# Patient Record
Sex: Male | Born: 1955 | Race: White | Hispanic: No | Smoking: Former smoker
Health system: Southern US, Community
[De-identification: ages and names within clinical notes are randomized; demographics above are authoritative.]

## PROBLEM LIST (undated history)

## (undated) DIAGNOSIS — I119 Hypertensive heart disease without heart failure: Secondary | ICD-10-CM

## (undated) DIAGNOSIS — M797 Fibromyalgia: Secondary | ICD-10-CM

## (undated) DIAGNOSIS — F32A Depression, unspecified: Secondary | ICD-10-CM

## (undated) DIAGNOSIS — I5032 Chronic diastolic (congestive) heart failure: Secondary | ICD-10-CM

## (undated) DIAGNOSIS — M1990 Unspecified osteoarthritis, unspecified site: Secondary | ICD-10-CM

## (undated) DIAGNOSIS — K219 Gastro-esophageal reflux disease without esophagitis: Secondary | ICD-10-CM

## (undated) DIAGNOSIS — I10 Essential (primary) hypertension: Secondary | ICD-10-CM

## (undated) DIAGNOSIS — J449 Chronic obstructive pulmonary disease, unspecified: Secondary | ICD-10-CM

## (undated) DIAGNOSIS — F329 Major depressive disorder, single episode, unspecified: Secondary | ICD-10-CM

## (undated) HISTORY — DX: Chronic diastolic (congestive) heart failure: I50.32

## (undated) HISTORY — PX: SHOULDER SURGERY: SHX246

## (undated) HISTORY — DX: Unspecified osteoarthritis, unspecified site: M19.90

## (undated) HISTORY — PX: HERNIA REPAIR: SHX51

## (undated) HISTORY — DX: Morbid (severe) obesity due to excess calories: E66.01

## (undated) HISTORY — PX: KNEE ARTHROSCOPY: SHX127

## (undated) HISTORY — PX: CHOLECYSTECTOMY: SHX55

## (undated) HISTORY — PX: VASECTOMY: SHX75

## (undated) HISTORY — DX: Hypertensive heart disease without heart failure: I11.9

---

## 2007-02-18 ENCOUNTER — Emergency Department: Payer: Self-pay | Admitting: Emergency Medicine

## 2007-02-21 ENCOUNTER — Emergency Department: Payer: Self-pay | Admitting: Emergency Medicine

## 2008-11-23 ENCOUNTER — Ambulatory Visit: Payer: Self-pay | Admitting: Family Medicine

## 2009-05-11 ENCOUNTER — Ambulatory Visit: Payer: Self-pay | Admitting: Cardiology

## 2009-10-04 ENCOUNTER — Emergency Department: Payer: Self-pay | Admitting: Emergency Medicine

## 2010-01-17 ENCOUNTER — Emergency Department: Payer: Self-pay | Admitting: Emergency Medicine

## 2010-01-23 ENCOUNTER — Ambulatory Visit: Payer: Self-pay | Admitting: Unknown Physician Specialty

## 2010-02-08 ENCOUNTER — Ambulatory Visit: Payer: Self-pay | Admitting: Unknown Physician Specialty

## 2010-08-15 ENCOUNTER — Ambulatory Visit: Payer: Self-pay | Admitting: Pain Medicine

## 2010-08-21 ENCOUNTER — Ambulatory Visit: Payer: Self-pay | Admitting: Pain Medicine

## 2010-08-24 ENCOUNTER — Ambulatory Visit: Payer: Self-pay | Admitting: Pain Medicine

## 2010-09-13 ENCOUNTER — Ambulatory Visit: Payer: Self-pay | Admitting: Pain Medicine

## 2010-09-18 ENCOUNTER — Ambulatory Visit: Payer: Self-pay | Admitting: Pain Medicine

## 2012-01-08 ENCOUNTER — Ambulatory Visit: Payer: Self-pay | Admitting: Gastroenterology

## 2012-01-10 LAB — PATHOLOGY REPORT

## 2012-05-11 ENCOUNTER — Ambulatory Visit: Payer: Self-pay | Admitting: Internal Medicine

## 2012-09-23 ENCOUNTER — Ambulatory Visit: Payer: Self-pay | Admitting: Pain Medicine

## 2012-09-24 ENCOUNTER — Other Ambulatory Visit: Payer: Self-pay | Admitting: Pain Medicine

## 2012-10-21 ENCOUNTER — Ambulatory Visit: Payer: Self-pay | Admitting: Pain Medicine

## 2012-10-27 ENCOUNTER — Ambulatory Visit: Payer: Self-pay | Admitting: Pain Medicine

## 2013-06-15 ENCOUNTER — Ambulatory Visit: Payer: Self-pay | Admitting: Gastroenterology

## 2013-09-11 ENCOUNTER — Emergency Department: Payer: Self-pay | Admitting: Emergency Medicine

## 2013-09-13 ENCOUNTER — Ambulatory Visit: Payer: Self-pay | Admitting: Gastroenterology

## 2014-02-23 DIAGNOSIS — Z8719 Personal history of other diseases of the digestive system: Secondary | ICD-10-CM | POA: Insufficient documentation

## 2014-02-23 DIAGNOSIS — G8929 Other chronic pain: Secondary | ICD-10-CM | POA: Insufficient documentation

## 2014-02-23 DIAGNOSIS — M549 Dorsalgia, unspecified: Secondary | ICD-10-CM

## 2014-02-23 DIAGNOSIS — K5909 Other constipation: Secondary | ICD-10-CM | POA: Insufficient documentation

## 2014-09-05 ENCOUNTER — Emergency Department: Payer: Self-pay | Admitting: Emergency Medicine

## 2014-11-23 ENCOUNTER — Ambulatory Visit
Admission: RE | Admit: 2014-11-23 | Discharge: 2014-11-23 | Disposition: A | Discharge status: Home / Self Care | Payer: Medicaid Other | Source: Ambulatory Visit | Attending: Surgery | Admitting: Surgery

## 2014-11-23 ENCOUNTER — Encounter
Admission: RE | Admit: 2014-11-23 | Discharge: 2014-11-23 | Disposition: A | Discharge status: Home / Self Care | Payer: Medicaid Other | Source: Ambulatory Visit | Attending: Surgery | Admitting: Surgery

## 2014-11-23 DIAGNOSIS — Z720 Tobacco use: Secondary | ICD-10-CM | POA: Insufficient documentation

## 2014-11-23 DIAGNOSIS — Z01810 Encounter for preprocedural cardiovascular examination: Secondary | ICD-10-CM | POA: Diagnosis present

## 2014-11-23 DIAGNOSIS — F329 Major depressive disorder, single episode, unspecified: Secondary | ICD-10-CM | POA: Insufficient documentation

## 2014-11-23 DIAGNOSIS — M1990 Unspecified osteoarthritis, unspecified site: Secondary | ICD-10-CM | POA: Diagnosis not present

## 2014-11-23 DIAGNOSIS — Z01811 Encounter for preprocedural respiratory examination: Secondary | ICD-10-CM | POA: Insufficient documentation

## 2014-11-23 DIAGNOSIS — F17200 Nicotine dependence, unspecified, uncomplicated: Secondary | ICD-10-CM

## 2014-11-23 DIAGNOSIS — M797 Fibromyalgia: Secondary | ICD-10-CM | POA: Diagnosis not present

## 2014-11-23 DIAGNOSIS — I10 Essential (primary) hypertension: Secondary | ICD-10-CM | POA: Insufficient documentation

## 2014-11-23 DIAGNOSIS — J449 Chronic obstructive pulmonary disease, unspecified: Secondary | ICD-10-CM | POA: Insufficient documentation

## 2014-11-23 HISTORY — DX: Depression, unspecified: F32.A

## 2014-11-23 HISTORY — DX: Fibromyalgia: M79.7

## 2014-11-23 HISTORY — DX: Essential (primary) hypertension: I10

## 2014-11-23 HISTORY — DX: Chronic obstructive pulmonary disease, unspecified: J44.9

## 2014-11-23 HISTORY — DX: Unspecified osteoarthritis, unspecified site: M19.90

## 2014-11-23 HISTORY — DX: Gastro-esophageal reflux disease without esophagitis: K21.9

## 2014-11-23 HISTORY — DX: Major depressive disorder, single episode, unspecified: F32.9

## 2014-11-23 NOTE — Patient Instructions (Signed)
  Your procedure is scheduled on: November 30 2014 Wednesday Report to Day Surgery. [facility] Entrance To find out your arrival time please call [phone] between 1PM - 3PM on Tuesday 11/29/2014.  Remember: Instructions that are not followed completely may result in serious medical risk, up to and including death, or upon the discretion of your surgeon and anesthesiologist your surgery may need to be rescheduled.    __x__ 1. Do not eat food or drink liquids after midnight. No gum chewing or hard candies.     __x__ 2. No Alcohol for 24 hours before or after surgery.   ____ 3. Bring all medications with you on the day of surgery if instructed.    __x__ 4. Notify your doctor if there is any change in your medical condition     (cold, fever, infections).     Do not wear jewelry, make-up, hairpins, clips or nail polish.  Do not wear lotions, powders, or perfumes. You may wear deodorant.  Do not shave 48 hours prior to surgery. Men may shave face and neck.  Do not bring valuables to the hospital.    [facility] is not responsible for any belongings or valuables.               Contacts, dentures or bridgework may not be worn into surgery.  Leave your suitcase in the car. After surgery it may be brought to your room.  For patients admitted to the hospital, discharge time is determined by your                treatment team.   Patients discharged the day of surgery will not be allowed to drive home.   Please read over the following fact sheets that you were given:   Surgical Site Infection Prevention   __x__ Take these medicines the morning of surgery with A SIP OF WATER:    1. lyrica  2. lisinopril  3. nexium  4.  5.  6.  ____ Fleet Enema (as directed)   __x__ Use CHG Soap as directed  ____ Use inhalers on the day of surgery  ____ Stop metformin 2 days prior to surgery    ____ Take 1/2 of usual insulin dose the night before surgery and none on the morning of surgery.   ____  Stop Coumadin/Plavix/aspirin on   ____ Stop Anti-inflammatories on    ____ Stop supplements until after surgery.    ____ Bring C-Pap to the hospital.

## 2014-11-29 ENCOUNTER — Encounter
Admission: RE | Admit: 2014-11-29 | Discharge: 2014-11-29 | Disposition: A | Discharge status: Home / Self Care | Payer: Medicaid Other | Source: Ambulatory Visit | Attending: Surgery | Admitting: Surgery

## 2014-11-29 DIAGNOSIS — Z01812 Encounter for preprocedural laboratory examination: Secondary | ICD-10-CM | POA: Insufficient documentation

## 2014-11-29 DIAGNOSIS — R222 Localized swelling, mass and lump, trunk: Secondary | ICD-10-CM | POA: Insufficient documentation

## 2014-11-29 LAB — CBC
HEMATOCRIT: 46.9 % (ref 40.0–52.0)
HEMOGLOBIN: 15 g/dL (ref 13.0–18.0)
MCH: 30.9 pg (ref 26.0–34.0)
MCHC: 32.1 g/dL (ref 32.0–36.0)
MCV: 96.4 fL (ref 80.0–100.0)
Platelets: 206 10*3/uL (ref 150–440)
RBC: 4.86 MIL/uL (ref 4.40–5.90)
RDW: 15.6 % — ABNORMAL HIGH (ref 11.5–14.5)
WBC: 11.4 10*3/uL — ABNORMAL HIGH (ref 3.8–10.6)

## 2014-11-29 LAB — BASIC METABOLIC PANEL
Anion gap: 11 (ref 5–15)
BUN: 15 mg/dL (ref 6–20)
CALCIUM: 8.9 mg/dL (ref 8.9–10.3)
CO2: 31 mmol/L (ref 22–32)
CREATININE: 0.9 mg/dL (ref 0.61–1.24)
Chloride: 100 mmol/L — ABNORMAL LOW (ref 101–111)
GFR calc Af Amer: >60 mL/min (ref >60)
GFR calc non Af Amer: >60 mL/min (ref >60)
Glucose, Bld: 161 mg/dL — ABNORMAL HIGH (ref 65–99)
Potassium: 4.1 mmol/L (ref 3.5–5.1)
Sodium: 142 mmol/L (ref 135–145)

## 2014-12-07 ENCOUNTER — Observation Stay
Admission: RE | Admit: 2014-12-07 | Discharge: 2014-12-07 | Disposition: A | Discharge status: Home / Self Care | Payer: Medicaid Other | Source: Ambulatory Visit | Attending: Surgery | Admitting: Surgery

## 2014-12-07 ENCOUNTER — Encounter
Admission: RE | Disposition: A | Discharge status: Home / Self Care | Payer: Self-pay | Source: Ambulatory Visit | Attending: Surgery

## 2014-12-07 ENCOUNTER — Ambulatory Visit: Payer: Medicaid Other | Admitting: Certified Registered"

## 2014-12-07 ENCOUNTER — Encounter: Payer: Self-pay | Admitting: *Deleted

## 2014-12-07 DIAGNOSIS — F17200 Nicotine dependence, unspecified, uncomplicated: Secondary | ICD-10-CM | POA: Diagnosis not present

## 2014-12-07 DIAGNOSIS — E669 Obesity, unspecified: Secondary | ICD-10-CM | POA: Insufficient documentation

## 2014-12-07 DIAGNOSIS — I10 Essential (primary) hypertension: Secondary | ICD-10-CM | POA: Insufficient documentation

## 2014-12-07 DIAGNOSIS — Z79899 Other long term (current) drug therapy: Secondary | ICD-10-CM | POA: Diagnosis not present

## 2014-12-07 DIAGNOSIS — L918 Other hypertrophic disorders of the skin: Secondary | ICD-10-CM | POA: Diagnosis not present

## 2014-12-07 DIAGNOSIS — Z9889 Other specified postprocedural states: Secondary | ICD-10-CM | POA: Diagnosis not present

## 2014-12-07 DIAGNOSIS — G4730 Sleep apnea, unspecified: Secondary | ICD-10-CM | POA: Insufficient documentation

## 2014-12-07 DIAGNOSIS — Z9049 Acquired absence of other specified parts of digestive tract: Secondary | ICD-10-CM | POA: Insufficient documentation

## 2014-12-07 DIAGNOSIS — M1990 Unspecified osteoarthritis, unspecified site: Secondary | ICD-10-CM | POA: Insufficient documentation

## 2014-12-07 DIAGNOSIS — Z6837 Body mass index (BMI) 37.0-37.9, adult: Secondary | ICD-10-CM | POA: Insufficient documentation

## 2014-12-07 DIAGNOSIS — D225 Melanocytic nevi of trunk: Secondary | ICD-10-CM | POA: Insufficient documentation

## 2014-12-07 DIAGNOSIS — Z791 Long term (current) use of non-steroidal anti-inflammatories (NSAID): Secondary | ICD-10-CM | POA: Diagnosis not present

## 2014-12-07 DIAGNOSIS — L723 Sebaceous cyst: Principal | ICD-10-CM | POA: Insufficient documentation

## 2014-12-07 DIAGNOSIS — J449 Chronic obstructive pulmonary disease, unspecified: Secondary | ICD-10-CM | POA: Insufficient documentation

## 2014-12-07 DIAGNOSIS — L720 Epidermal cyst: Secondary | ICD-10-CM | POA: Diagnosis present

## 2014-12-07 DIAGNOSIS — D2271 Melanocytic nevi of right lower limb, including hip: Secondary | ICD-10-CM | POA: Diagnosis not present

## 2014-12-07 DIAGNOSIS — L729 Follicular cyst of the skin and subcutaneous tissue, unspecified: Secondary | ICD-10-CM | POA: Diagnosis present

## 2014-12-07 DIAGNOSIS — F329 Major depressive disorder, single episode, unspecified: Secondary | ICD-10-CM | POA: Diagnosis not present

## 2014-12-07 DIAGNOSIS — K219 Gastro-esophageal reflux disease without esophagitis: Secondary | ICD-10-CM | POA: Insufficient documentation

## 2014-12-07 HISTORY — PX: IRRIGATION AND DEBRIDEMENT SEBACEOUS CYST: SHX5255

## 2014-12-07 SURGERY — IRRIGATION AND DEBRIDEMENT SEBACEOUS CYST
Anesthesia: General

## 2014-12-07 MED ORDER — HYDROCODONE-ACETAMINOPHEN 5-325 MG PO TABS: 1.0000 | ORAL_TABLET | Freq: Four times a day (QID) | ORAL | Status: DC | PRN

## 2014-12-07 MED ORDER — LIDOCAINE HCL (PF) 1 % IJ SOLN
INTRAMUSCULAR | Status: AC
  Filled 2014-12-07: qty 30

## 2014-12-07 MED ORDER — BUPIVACAINE HCL (PF) 0.5 % IJ SOLN
INTRAMUSCULAR | Status: AC
  Filled 2014-12-07: qty 30

## 2014-12-07 MED ORDER — FENTANYL CITRATE (PF) 100 MCG/2ML IJ SOLN
INTRAMUSCULAR | Status: AC
  Filled 2014-12-07: qty 2

## 2014-12-07 MED ORDER — FENTANYL CITRATE (PF) 100 MCG/2ML IJ SOLN
25.0000 ug | INTRAMUSCULAR | Status: DC | PRN
  Administered 2014-12-07 (×4): 25 ug via INTRAVENOUS

## 2014-12-07 MED ORDER — SODIUM BICARBONATE 4 % IV SOLN
INTRAVENOUS | Status: AC
  Filled 2014-12-07: qty 5

## 2014-12-07 MED ORDER — DEXTROSE-NACL 5-0.45 % IV SOLN
INTRAVENOUS | Status: DC
  Administered 2014-12-07: 13:00:00 via INTRAVENOUS

## 2014-12-07 MED ORDER — MORPHINE SULFATE 4 MG/ML IJ SOLN: 4.0000 mg | INTRAMUSCULAR | Status: DC | PRN

## 2014-12-07 MED ORDER — HYDROCODONE-ACETAMINOPHEN 5-325 MG PO TABS
1.0000 | ORAL_TABLET | Freq: Four times a day (QID) | ORAL | Status: DC | PRN
  Administered 2014-12-07: 1 via ORAL
  Filled 2014-12-07: qty 1

## 2014-12-07 MED ORDER — ONDANSETRON HCL 4 MG/2ML IJ SOLN: 4.0000 mg | Freq: Once | INTRAMUSCULAR | Status: DC | PRN

## 2014-12-07 MED ORDER — ESMOLOL HCL 10 MG/ML IV SOLN
INTRAVENOUS | Status: DC | PRN
  Administered 2014-12-07 (×2): 20 mg via INTRAVENOUS
  Administered 2014-12-07: 30 mg via INTRAVENOUS

## 2014-12-07 MED ORDER — LACTATED RINGERS IV SOLN
INTRAVENOUS | Status: DC
  Administered 2014-12-07: 50 mL/h via INTRAVENOUS
  Administered 2014-12-07: 09:00:00 via INTRAVENOUS

## 2014-12-07 MED ORDER — MIDAZOLAM HCL 2 MG/2ML IJ SOLN
INTRAMUSCULAR | Status: DC | PRN
  Administered 2014-12-07 (×2): 1 mg via INTRAVENOUS
  Administered 2014-12-07: 2 mg via INTRAVENOUS

## 2014-12-07 MED ORDER — SODIUM BICARBONATE 4 % IV SOLN
INTRAVENOUS | Status: DC | PRN
  Administered 2014-12-07: 35 mL via SUBCUTANEOUS

## 2014-12-07 MED ORDER — FENTANYL CITRATE (PF) 100 MCG/2ML IJ SOLN
INTRAMUSCULAR | Status: DC | PRN
  Administered 2014-12-07 (×3): 25 ug via INTRAVENOUS
  Administered 2014-12-07: 50 ug via INTRAVENOUS
  Administered 2014-12-07: 25 ug via INTRAVENOUS
  Administered 2014-12-07: 50 ug via INTRAVENOUS
  Administered 2014-12-07 (×2): 25 ug via INTRAVENOUS
  Administered 2014-12-07: 50 ug via INTRAVENOUS

## 2014-12-07 MED ORDER — FAMOTIDINE 20 MG PO TABS
20.0000 mg | ORAL_TABLET | Freq: Every day | ORAL | Status: DC
  Administered 2014-12-07: 20 mg via ORAL
  Filled 2014-12-07: qty 1

## 2014-12-07 MED ORDER — PROPOFOL INFUSION 10 MG/ML OPTIME
INTRAVENOUS | Status: DC | PRN
  Administered 2014-12-07: 50 ug/kg/min via INTRAVENOUS

## 2014-12-07 SURGICAL SUPPLY — 24 items
BLADE SURG 15 STRL LF DISP TIS (BLADE) ×2 IMPLANT
BLADE SURG 15 STRL SS (BLADE) ×4
DRAPE LAPAROTOMY 100X77 ABD (DRAPES) ×3 IMPLANT
DRSG TEGADERM 2-3/8X2-3/4 SM (GAUZE/BANDAGES/DRESSINGS) ×15 IMPLANT
DRSG TEGADERM 4X4.75 (GAUZE/BANDAGES/DRESSINGS) ×3 IMPLANT
DRSG TELFA 3X8 NADH (GAUZE/BANDAGES/DRESSINGS) ×3 IMPLANT
ELECT CAUTERY NEEDLE TIP 1.0 (MISCELLANEOUS) ×3
ELECTRODE CAUTERY NEDL TIP 1.0 (MISCELLANEOUS) ×1 IMPLANT
GAUZE SPONGE 4X4 12PLY STRL (GAUZE/BANDAGES/DRESSINGS) ×3 IMPLANT
GLOVE BIO SURGEON STRL SZ7.5 (GLOVE) ×9 IMPLANT
GLOVE INDICATOR 8.0 STRL GRN (GLOVE) ×3 IMPLANT
GOWN STRL REUS W/ TWL LRG LVL3 (GOWN DISPOSABLE) ×3 IMPLANT
GOWN STRL REUS W/TWL LRG LVL3 (GOWN DISPOSABLE) ×6
KIT RM TURNOVER STRD PROC AR (KITS) ×3 IMPLANT
LABEL OR SOLS (LABEL) IMPLANT
NDL SAFETY 25GX1.5 (NEEDLE) ×3 IMPLANT
NS IRRIG 500ML POUR BTL (IV SOLUTION) ×3 IMPLANT
PACK BASIN MINOR ARMC (MISCELLANEOUS) ×3 IMPLANT
PAD GROUND ADULT SPLIT (MISCELLANEOUS) ×3 IMPLANT
SUT ETHILON 3-0 KS 30 BLK (SUTURE) ×9 IMPLANT
SUT VIC AB 3-0 SH 27 (SUTURE) ×2
SUT VIC AB 3-0 SH 27X BRD (SUTURE) ×1 IMPLANT
SYR BULB EAR ULCER 3OZ GRN STR (SYRINGE) ×3 IMPLANT
SYRINGE 10CC LL (SYRINGE) ×3 IMPLANT

## 2014-12-07 NOTE — Anesthesia Postprocedure Evaluation (Signed)
  Anesthesia Post-op Note  Patient: Hunter White  Procedure(s) Performed: Procedure(s): IRRIGATION AND DEBRIDEMENT SEBACEOUS CYST (N/A)  Anesthesia type:General  Patient location: PACU  Post pain: Pain level controlled  Post assessment: Post-op Vital signs reviewed, Patient's Cardiovascular Status Stable, Respiratory Function Stable, Patent Airway and No signs of Nausea or vomiting  Post vital signs: Reviewed and stable  Last Vitals:  Filed Vitals:   12/07/14 1029  BP:   Pulse:   Temp: 37.3 C  Resp:     Level of consciousness: awake, alert  and patient cooperative  Complications: No apparent anesthesia complications

## 2014-12-07 NOTE — Progress Notes (Signed)
Per Dr. Leanora Cover Regular diet order

## 2014-12-07 NOTE — Op Note (Signed)
12/07/2014  10:29 AM  PATIENT:  Hunter White  59 y.o. male  PRE-OPERATIVE DIAGNOSIS:  SEBACEOUS CYST  POST-OPERATIVE DIAGNOSIS:  same  PROCEDURE:  Procedure(s): IRRIGATION AND DEBRIDEMENT SEBACEOUS CYST (N/A)  SURGEON:  Surgeon(s) and Role:    * Dia Crawford III, MD - Primary   ASSISTANTS: none   ANESTHESIA:   MAC  EBL:  Total I/O In: 700 [I.V.:700] Out: 5 [Blood:5]   DRAINS: none   LOCAL MEDICATIONS USED:  LIDOCAINE    DISPOSITION OF SPECIMEN:  PATHOLOGY   DICTATION: .Dragon Dictation with the patient supine position infection appropriate sedation the patient was then turned into the prone position appropriately padded and positioned. He had multiple areas on his back which a previously been marked. Each was injected with 1% Xylocaine buffered with sodium bicarbonate elliptical incision was made around each. Attempt was made remove all of the cyst wall and each of the 4 areas on his back. One in the mid back to the previously been incised was the deepest and required the deepest. Subcutaneous space was obliterated with 3-0 Vicryl skin was closed with 3-0 nylon. 3 were closed simply with 3-0 nylon sutures on the skin. Sterile dressings were applied. The area was then reprepped and draped around the buttock and thigh lesions. Each was ellipsed after Xylocaine anesthesia and closed with 3-0 nylon. Sterile dressings were applied. The patient recovery room having tolerated procedure well. Sponge instrument needle count were correct 2 in the operative.   PLAN OF CARE: Discharge to home after PACU  PATIENT DISPOSITION:  PACU - hemodynamically stable.   Dia Crawford III, MD

## 2014-12-07 NOTE — Progress Notes (Signed)
Preoperative Review   Patient is met in the preoperative holding area. The history is reviewed in the chart and with the patient. I personally reviewed the options and rationale as well as the risks of this procedure that have been previously discussed with the patient. All questions asked by the patient and/or family were answered to their satisfaction.  Patient agrees to proceed with this procedure at this time.  His cardiac and pulmonary exams are unchanged. There are 2 other nodules one on his left buttock and one on his right thigh that he would like to have removed in addition to the lesions on his back. We have planned these elective excisions under monitored anesthetic care with his severe chronic lung disease.   

## 2014-12-07 NOTE — Transfer of Care (Signed)
Immediate Anesthesia Transfer of Care Note  Patient: Hunter White  Procedure(s) Performed: Procedure(s): IRRIGATION AND DEBRIDEMENT SEBACEOUS CYST (N/A)  Patient Location: PACU  Anesthesia Type:General  Level of Consciousness: sedated  Airway & Oxygen Therapy: Patient Spontanous Breathing  Post-op Assessment: Report given to RN and Post -op Vital signs reviewed and stable  Post vital signs: Reviewed and stable  Last Vitals:  Filed Vitals:   12/07/14 0741  BP: 163/85  Pulse: 86  Temp: 36.7 C  Resp: 18    Complications: No apparent anesthesia complications

## 2014-12-07 NOTE — Progress Notes (Addendum)
Pt A and O x 4. VSS. Pt tolerating diet well. Pt had surgery with 6 sites. Pt has a small amount of drainage on dressing. Pt was not here for whole shift. Pt was admitted because he could not be weaned from O2. Pt was eventually weaned and had no problems with sats dropping. Pt was discharged and IV removed. Pt had prescriptions given and discharge instructions given. Pt voiced that he understood directions and had no other questions. Pt left via wheelchair with auxillary.

## 2014-12-07 NOTE — Discharge Summary (Signed)
Patient ID: ORVAN PAPADAKIS MRN: [id number] DOB/AGE: [birth year] 59 y.o.  Admit date: 12/07/2014 Discharge date: 12/07/2014  Discharge Diagnoses:  Sebaceous cysts  Procedures Performed: Excision multiple sebaceous cysts  Discharged Condition: fair  Hospital Course: He was admitted through same-day surgery for excision of multiple sebaceous cysts and skin tags. The procedure was uncomplicated and accomplished under monitored anesthetic care anesthesia. However with his obesity chronic lung disease and sleep apnea we thought an important observe him for 6-8 hours following surgery. He's done well currently off oxygen with a reasonable oxygen saturation. We will discharge him home this evening in follow-up in the office in 7-10 days time.  Discharge Orders: Discharge Instructions    Call MD for:  redness, tenderness, or signs of infection (pain, swelling, redness, odor or green/yellow discharge around incision site)    Complete by:  As directed      Call MD for:  severe uncontrolled pain    Complete by:  As directed      Call MD for:  temperature >100.4    Complete by:  As directed      Diet - low sodium heart healthy    Complete by:  As directed      Diet - low sodium heart healthy    Complete by:  As directed      Driving Restrictions    Complete by:  As directed   No driving while taking pain medication     Increase activity slowly    Complete by:  As directed      Increase activity slowly    Complete by:  As directed      Lifting restrictions    Complete by:  As directed   No heavy lifting     Remove dressing in 48 hours    Complete by:  As directed      Remove dressing in 48 hours    Complete by:  As directed            Disposition: Final discharge disposition not confirmed  Discharge Medications:  Current facility-administered medications:  .  dextrose 5 %-0.45 % sodium chloride infusion, , Intravenous, Continuous, Dia Crawford III, MD, Last Rate: 50 mL/hr at 12/07/14  1301 .  famotidine (PEPCID) tablet 20 mg, 20 mg, Oral, Daily, Dia Crawford III, MD, 20 mg at 12/07/14 1300 .  fentaNYL (SUBLIMAZE) 100 MCG/2ML injection, , , ,  .  HYDROcodone-acetaminophen (NORCO/VICODIN) 5-325 MG per tablet 1-2 tablet, 1-2 tablet, Oral, Q6H PRN, Dia Crawford III, MD, 1 tablet at 12/07/14 1313 .  morphine 4 MG/ML injection 4 mg, 4 mg, Intravenous, Q2H PRN, Dia Crawford III, MD  Follwup: Follow-up Information    Schedule an appointment as soon as possible for a visit with [facility] [place].   Why:  For suture removal, For wound re-check   Contact information:   [place] [phone]      Signed: Dia Crawford III 12/07/2014, 5:36 PM

## 2014-12-07 NOTE — Discharge Instructions (Signed)
Notify MD for any worsening redness, swelling, bleeding or drainage from the incision sites, fever of 100.4 or higher or pain that is not relieved with medications.   Be sure to call Dr. Rolin Barry office tomorrow for a follow-up appointment in 1 week.  [phone])

## 2014-12-07 NOTE — Anesthesia Preprocedure Evaluation (Addendum)
Anesthesia Evaluation  Patient identified by MRN, date of birth, ID band Patient awake    Reviewed: Allergy & Precautions, NPO status , Patient's Chart, lab work & pertinent test results  Airway Mallampati: II       Dental   Pulmonary shortness of breath, with exertion and lying, COPD COPD inhaler, Current Smoker,          Cardiovascular hypertension, Pt. on medications     Neuro/Psych Depression  Neuromuscular disease    GI/Hepatic Neg liver ROS, GERD-  ,  Endo/Other  Hypothyroidism Morbid obesity  Renal/GU   negative genitourinary   Musculoskeletal  (+) Fibromyalgia -  Abdominal   Peds negative pediatric ROS (+)  Hematology negative hematology ROS (+)   Anesthesia Other Findings   Reproductive/Obstetrics                            Anesthesia Physical Anesthesia Plan  ASA: III  Anesthesia Plan: MAC   Post-op Pain Management:    Induction: Intravenous  Airway Management Planned: Natural Airway and Nasal Cannula  Additional Equipment:   Intra-op Plan:   Post-operative Plan:   Informed Consent: I have reviewed the patients History and Physical, chart, labs and discussed the procedure including the risks, benefits and alternatives for the proposed anesthesia with the patient or authorized representative who has indicated his/her understanding and acceptance.     Plan Discussed with: CRNA  Anesthesia Plan Comments:         Anesthesia Quick Evaluation

## 2014-12-08 LAB — SURGICAL PATHOLOGY

## 2014-12-21 ENCOUNTER — Ambulatory Visit (INDEPENDENT_AMBULATORY_CARE_PROVIDER_SITE_OTHER): Payer: Medicaid Other | Admitting: Surgery

## 2014-12-21 ENCOUNTER — Encounter: Payer: Self-pay | Admitting: Surgery

## 2014-12-21 VITALS — BP 154/73 | HR 79 | Temp 97.9°F | Ht 70.0 in | Wt 262.8 lb

## 2014-12-21 DIAGNOSIS — E669 Obesity, unspecified: Secondary | ICD-10-CM

## 2014-12-21 DIAGNOSIS — L729 Follicular cyst of the skin and subcutaneous tissue, unspecified: Secondary | ICD-10-CM

## 2014-12-21 NOTE — Progress Notes (Signed)
Patient is status post excision of multiple sebaceous cysts on his back. This was performed on June 8. He is doing well. All the wounds healed nicely except that on the right posterior shoulder.  Postoperatively he had a prolonged stay in the recovery room due to respiratory distress.  He is requesting pulmonary medicine consultation. Furthermore, he has multiple axillary skin tags which he would liked to be removed in the office at a later date and this will be arranged  Physical examination demonstrated a morbidly obese white male.   Sutures were removed from all the operative sites. Right posterior shoulder operative site demonstrates superficial dehiscence.   There is granulation tissue present and no cellulitis or undrained purulence.  Impression overall doing well except for one operative site.   Plan: Dry dressing to right upper posterior shoulder. Scheduled follow-up with Dr. Pat Patrick at the later time for excision of multiple skin tags. Palpation pulmonary medicine consultation.

## 2014-12-21 NOTE — Patient Instructions (Signed)
Call our office with any questions or concerns.   Follow-up in our office with Dr. Pat Patrick to discuss skin tag removal.  Wash with soap and water and apply neosporin and quaze to right shoulder wound daily.  We will refer you to a Pulmonologist and our office will be in contact as soon as this appointment is made.

## 2014-12-23 ENCOUNTER — Other Ambulatory Visit: Payer: Self-pay

## 2014-12-28 ENCOUNTER — Encounter: Payer: Self-pay | Admitting: Internal Medicine

## 2014-12-28 ENCOUNTER — Ambulatory Visit (INDEPENDENT_AMBULATORY_CARE_PROVIDER_SITE_OTHER): Payer: Medicaid Other | Admitting: Internal Medicine

## 2014-12-28 ENCOUNTER — Institutional Professional Consult (permissible substitution): Payer: Medicaid Other | Admitting: Internal Medicine

## 2014-12-28 DIAGNOSIS — R05 Cough: Secondary | ICD-10-CM | POA: Insufficient documentation

## 2014-12-28 DIAGNOSIS — J449 Chronic obstructive pulmonary disease, unspecified: Secondary | ICD-10-CM | POA: Diagnosis not present

## 2014-12-28 DIAGNOSIS — R0902 Hypoxemia: Secondary | ICD-10-CM

## 2014-12-28 DIAGNOSIS — Z720 Tobacco use: Secondary | ICD-10-CM

## 2014-12-28 DIAGNOSIS — G479 Sleep disorder, unspecified: Secondary | ICD-10-CM

## 2014-12-28 DIAGNOSIS — R053 Chronic cough: Secondary | ICD-10-CM | POA: Insufficient documentation

## 2014-12-28 NOTE — Assessment & Plan Note (Signed)
Tobacco Cessation - Counseling regarding benefits of smoking cessation strategies was provided for more than 12 min. - Educated that at this time smoking- cessation represents the single most important step that patient can take to enhance the length and quality of live. - Educated patient regarding alternatives of behavior interventions, pharmacotherapy including NRT and non-nicotine therapy such, and combinations of both. - Patient at this time will try to quit on his own. 

## 2014-12-28 NOTE — Assessment & Plan Note (Addendum)
Clinical history falls in line with a diagnosis of COPD, will further evaluate with pulmonary function testing to class level of obstruction objectively. 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 Patient is to reduce smoking even more and plan for eventually quitting smoking. mMRC =3, CAT=23 Ironically, given the level of his scoring he's not had frequent exacerbations or need for steroids or urgent care/emergency room visits. Today in the office he had a brief6MWT and had stopped early due to chronic leg and hip pain, his lowest saturation was 84% and he rebound quickly to 87%.  Plan: -Tobacco Cessation -Pulmonary function testing and 6 minute walk test -Weight loss and continue with exercise at the pool -Dietary and lifestyle modifications -Overnight pulse ox surgery study -Pursed lip breathing

## 2014-12-28 NOTE — Assessment & Plan Note (Addendum)
She noted with saturations today resting at 85% that improved a 7%. He had a 70 walk tests that showed a low saturation of 84" rebound 87%, lowest heart was noted to be at 89 highest heart rate was noted to be at 110. He walked about 144 m.  I believe the patient may have COPD along with sleep apnea and that adding to his hypoxia. Further workup and evaluation as dictated and plan for COPD and OSA Also, plan for overnight pulse oximetry study

## 2014-12-28 NOTE — Assessment & Plan Note (Signed)
Patient with significant risk factors for obstructive sleep apnea. STOP BANG = 8/8  Plan: Split-night study

## 2014-12-28 NOTE — Assessment & Plan Note (Signed)
Secondary to COPD and tobacco abuse.

## 2014-12-28 NOTE — Progress Notes (Signed)
Date: 12/28/2014  MRN# [id number] Hunter White Jan 16, 1956  Referring Physician:   LAITH ANTONELLI is a 59 y.o. old male seen in consultation for COPD and OSA evaluation  CC:  Chief Complaint  Patient presents with  . Advice Only    Pt referred by Dr. Marina Gravel for sob. MMRC 3 Pt says this has been ongoing for 6 months. Pt has occas productive cough, wheezing and chest tightness.    HPI:  Patient is a pleasant 59 year old male presenting today for evaluation of COPD and affect and sleep apnea. He was recently seen at the surgical center for sebaceous cyst removal on his back, at that time he was noted to have apneic events and descending down to the lower 80s, this was performed on June 8, he was referred to pulmonary for further evaluation. Patient states that he's been having shortness of breath especially with exertion for the past 5-6 months around his doctor's visits he may have oxygen saturations in the mid to upper 80s and was advised to do breathing exercises which include purse lipped breathing, but he states this is been difficult for him due to his anxiety level. Endorses shortness of breath with exertion, cough (all the time) cough is mild productive of clear sputum. Patient states he is a long-time smoker, 2 packs per day for at least 20 years currently down to 67 cigarettes per day, no alcohol abuse, had a previous cocaine use (last used 10 years ago). Today patient is also accompanied by his daughter who states that he does have sleeping and snoring problems; see questionnaire below. Overall patient has been told he has COPD that has not been formally diagnosed with it, he denies any urgent care/emergency room visits or need for steroids in the last 2-3 years. He does endorse intermittent leg swelling (left greater than right)   Obstructive Sleep Apnea Screening The patient was screened with the STOP-BANG questionnaire. >3 positive responses is considered a positive  screen  Snoring  YES Tiredness  YES Observed Apnea YES Pressure (HTN) YES  BMI >35  YES Age > 50  YES Neck >17"  YES Gender (male) YES  Total: 8/8   Screen: POSITIVE      PMHX:   Past Medical History  Diagnosis Date  . Hypertension   . Fibromyalgia   . COPD (chronic obstructive pulmonary disease)   . Depression   . GERD (gastroesophageal reflux disease)   . Arthritis    Surgical Hx:  Past Surgical History  Procedure Laterality Date  . Cholecystectomy    . Knee arthroscopy Left   . Vasectomy    . Hernia repair    . Shoulder surgery Right   . Irrigation and debridement sebaceous cyst N/A 12/07/2014    Procedure: IRRIGATION AND DEBRIDEMENT SEBACEOUS CYST;  Surgeon: Dia Crawford III, MD;  Location: [facility] ORS;  Service: General;  Laterality: N/A;   Family Hx:  Family History  Problem Relation Age of Onset  . Cancer Father    Social Hx:   History  Substance Use Topics  . Smoking status: Current Every Day Smoker -- 0.25 packs/day for 35 years    Types: Cigarettes  . Smokeless tobacco: Former Systems developer    Quit date: 11/22/1973  . Alcohol Use: No   Medication:   Current Outpatient Rx  Name  Route  Sig  Dispense  Refill  . acetaminophen (TYLENOL) 650 MG CR tablet   Oral   Take 1,300 mg by mouth every 8 (eight) hours  as needed for pain.         Marland Kitchen esomeprazole (NEXIUM) 40 MG capsule   Oral   Take 40 mg by mouth 2 (two) times daily before a meal.         . hydrochlorothiazide (HYDRODIURIL) 25 MG tablet   Oral   Take 25 mg by mouth daily.         Marland Kitchen lisinopril (PRINIVIL,ZESTRIL) 40 MG tablet   Oral   Take 40 mg by mouth daily.         . pregabalin (LYRICA) 75 MG capsule   Oral   Take 75 mg by mouth daily.             Allergies:  Review of patient's allergies indicates no known allergies.  Review of Systems: Gen:  Denies  fever, sweats, chills HEENT: Denies blurred vision, double vision, ear pain, eye pain, hearing loss, nose bleeds, sore throat Cvc:   No dizziness, chest pain or heaviness Resp:   Shortness of breath, dyspnea on exertion, low oxygen level, chronic cough Gi: Denies swallowing difficulty, stomach pain, nausea or vomiting, diarrhea, constipation, bowel incontinence Gu:  Denies bladder incontinence, burning urine Ext:   Mr. chronic joint pain, intermittent lower extremity swelling, left greater than right Skin: No skin rash, easy bruising or bleeding or hives Endoc:  No polyuria, polydipsia , polyphagia or weight change Psych: No depression, insomnia or hallucinations  Other:  All other systems negative  Physical Examination:   VS: BP 82/50 mmHg  Pulse 70  Temp(Src) 97.5 F (36.4 C) (Oral)  Ht 5\' 10"  (1.778 m)  Wt 257 lb (116.574 kg)  BMI 36.88 kg/m2  SpO2 85%  General Appearance: No distress  Neuro:without focal findings, mental status, speech normal, alert and oriented, cranial nerves 2-12 intact, reflexes normal and symmetric, sensation grossly normal  HEENT: PERRLA, EOM intact, no ptosis, no other lesions noticed; Mallampati 3 Pulmonary: Mild, shallow breath sounds. Diffuse slight/mild expiratory wheezes, no rales, no crackles  Sputum Production:  none CardiovascularNormal S1,S2.  No m/r/g.  Abdominal aorta pulsation normal.    Abdomen: Benign, Soft, non-tender, No masses, hepatosplenomegaly, No lymphadenopathy Renal:  No costovertebral tenderness  GU:  No performed at this time. Endoc: No evident thyromegaly, no signs of acromegaly or Cushing features Skin:   warm, no rashes, no ecchymosis. Mild peripheral cyanosis in the lips Extremities: normal, clubbing, no edema, warm with normal capillary refill. Other findings:none   Rad results: (The following images and results were reviewed by Dr. Stevenson Clinch). EXAM: CHEST 2 VIEW June 2016  COMPARISON: PA and lateral chest x-ray dated Nov 23, 2008  FINDINGS: The lungs are mildly hyperinflated with hemidiaphragm flattening. The interstitial markings are coarse  bilaterally. There is no interstitial or alveolar infiltrate. No pulmonary parenchymal nodules are evident. The heart and pulmonary vascularity are normal. The trachea is midline. The bony thorax is unremarkable.  IMPRESSION: COPD. Increased interstitial lung markings likely reflect the patient's smoking history. There is no acute cardiopulmonary abnormality.   Assessment and Plan: 59 year old male seen in consultation for hypoxia, COPD evaluation, obstructive sleep apnea evaluation. COPD (chronic obstructive pulmonary disease) Clinical history falls in line with a diagnosis of COPD, will further evaluate with pulmonary function testing to class level of obstruction objectively. 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 Patient is to reduce smoking even more and plan for eventually quitting smoking. mMRC =3, CAT=23 Ironically, given the level of his scoring he's not had  frequent exacerbations or need for steroids or urgent care/emergency room visits. Today in the office he had a brief6MWT and had stopped early due to chronic leg and hip pain, his lowest saturation was 84% and he rebound quickly to 87%.  Plan: -Tobacco Cessation -Pulmonary function testing and 6 minute walk test -Weight loss and continue with exercise at the pool -Dietary and lifestyle modifications -Overnight pulse ox surgery study -Pursed lip breathing  Hypoxia She noted with saturations today resting at 85% that improved a 7%. He had a 70 walk tests that showed a low saturation of 84" rebound 87%, lowest heart was noted to be at 89 highest heart rate was noted to be at 110. He walked about 144 m.  I believe the patient may have COPD along with sleep apnea and that adding to his hypoxia. Further workup and evaluation as dictated and plan for COPD and OSA Also, plan for overnight pulse oximetry study  Tobacco abuse Tobacco Cessation - Counseling regarding benefits of  smoking cessation strategies was provided for more than 12 min. - Educated that at this time smoking- cessation represents the single most important step that patient can take to enhance the length and quality of live. - Educated patient regarding alternatives of behavior interventions, pharmacotherapy including NRT and non-nicotine therapy such, and combinations of both. - Patient at this time will try to quit on his own.   Sleep disturbance Patient with significant risk factors for obstructive sleep apnea. STOP BANG = 8/8  Plan: Split-night study  Chronic cough Secondary to COPD and tobacco abuse.    Updated Medication List Outpatient Encounter Prescriptions as of 12/28/2014  Medication Sig  . acetaminophen (TYLENOL) 650 MG CR tablet Take 1,300 mg by mouth every 8 (eight) hours as needed for pain.  Marland Kitchen esomeprazole (NEXIUM) 40 MG capsule Take 40 mg by mouth 2 (two) times daily before a meal.  . hydrochlorothiazide (HYDRODIURIL) 25 MG tablet Take 25 mg by mouth daily.  Marland Kitchen lisinopril (PRINIVIL,ZESTRIL) 40 MG tablet Take 40 mg by mouth daily.  . pregabalin (LYRICA) 75 MG capsule Take 75 mg by mouth daily.   No facility-administered encounter medications on file as of 12/28/2014.    Orders for this visit: Orders Placed This Encounter  Procedures  . Pulse oximetry, overnight    Standing Status: Future     Number of Occurrences:      Standing Expiration Date: 12/28/2015  . Pulmonary function test    Standing Status: Future     Number of Occurrences:      Standing Expiration Date: 12/28/2015    Order Specific Question:  Where should this test be performed?    Answer:  [facility]    Order Specific Question:  Full PFT: includes the following: basic spirometry, spirometry pre & post bronchodilator, diffusion capacity (DLCO), lung volumes    Answer:  Full PFT    Order Specific Question:  MIP/MEP    Answer:  No    Order Specific Question:  6 minute walk    Answer:  Yes    Order  Specific Question:  ABG    Answer:  No    Order Specific Question:  Diffusion capacity (DLCO)    Answer:  No    Order Specific Question:  Lung volumes    Answer:  No    Order Specific Question:  Methacholine challenge    Answer:  No  . Split night study    Standing Status: Future     Number  of Occurrences:      Standing Expiration Date: 12/28/2015    Order Specific Question:  Where should this test be performed:    Answer:  [place]     Thank  you for the consultation and for allowing [facility] to assist in the care of your patient. Our recommendations are noted above.  Please contact us if we can be of further service.   Vilinda Boehringer, MD [facility] Office Number: [phone]

## 2014-12-28 NOTE — Patient Instructions (Addendum)
Follow up with Dr. Stevenson Clinch in 1 month - pulmonary function testing and 6 minute walk test prior to follow up - split night study prior to follow up visit.  - please reduce and eventually quit smoking  - ONO

## 2015-01-13 ENCOUNTER — Ambulatory Visit: Payer: Medicaid Other | Admitting: Surgery

## 2015-01-16 ENCOUNTER — Ambulatory Visit: Payer: Medicaid Other | Admitting: Surgery

## 2015-01-17 ENCOUNTER — Ambulatory Visit: Payer: Medicaid Other | Attending: Pulmonary Disease

## 2015-01-17 DIAGNOSIS — G4733 Obstructive sleep apnea (adult) (pediatric): Secondary | ICD-10-CM | POA: Diagnosis not present

## 2015-01-18 ENCOUNTER — Other Ambulatory Visit: Payer: Self-pay | Admitting: Internal Medicine

## 2015-01-18 ENCOUNTER — Ambulatory Visit (HOSPITAL_BASED_OUTPATIENT_CLINIC_OR_DEPARTMENT_OTHER): Payer: Medicaid Other | Admitting: Pulmonary Disease

## 2015-01-18 DIAGNOSIS — J449 Chronic obstructive pulmonary disease, unspecified: Secondary | ICD-10-CM

## 2015-01-18 DIAGNOSIS — G4730 Sleep apnea, unspecified: Secondary | ICD-10-CM | POA: Diagnosis not present

## 2015-01-18 DIAGNOSIS — R0902 Hypoxemia: Secondary | ICD-10-CM

## 2015-01-24 ENCOUNTER — Encounter: Payer: Self-pay | Admitting: Internal Medicine

## 2015-01-25 ENCOUNTER — Ambulatory Visit: Payer: Self-pay | Admitting: Surgery

## 2015-01-26 ENCOUNTER — Ambulatory Visit (INDEPENDENT_AMBULATORY_CARE_PROVIDER_SITE_OTHER): Payer: Medicaid Other | Admitting: Surgery

## 2015-01-26 VITALS — BP 153/70 | HR 89 | Temp 97.9°F | Resp 20 | Ht 70.0 in | Wt 266.0 lb

## 2015-01-26 DIAGNOSIS — Q828 Other specified congenital malformations of skin: Secondary | ICD-10-CM

## 2015-02-08 ENCOUNTER — Encounter: Payer: Self-pay | Admitting: Internal Medicine

## 2015-02-08 ENCOUNTER — Ambulatory Visit (INDEPENDENT_AMBULATORY_CARE_PROVIDER_SITE_OTHER): Payer: Medicaid Other | Admitting: Internal Medicine

## 2015-02-08 VITALS — BP 120/70 | HR 80 | Ht 70.0 in | Wt 267.0 lb

## 2015-02-08 DIAGNOSIS — G4733 Obstructive sleep apnea (adult) (pediatric): Secondary | ICD-10-CM | POA: Diagnosis not present

## 2015-02-08 DIAGNOSIS — R053 Chronic cough: Secondary | ICD-10-CM

## 2015-02-08 DIAGNOSIS — J449 Chronic obstructive pulmonary disease, unspecified: Secondary | ICD-10-CM

## 2015-02-08 DIAGNOSIS — Z720 Tobacco use: Secondary | ICD-10-CM

## 2015-02-08 DIAGNOSIS — R0902 Hypoxemia: Secondary | ICD-10-CM | POA: Diagnosis not present

## 2015-02-08 DIAGNOSIS — R05 Cough: Secondary | ICD-10-CM

## 2015-02-08 DIAGNOSIS — R0600 Dyspnea, unspecified: Secondary | ICD-10-CM

## 2015-02-08 LAB — PULMONARY FUNCTION TEST
DL/VA % pred: 76 %
DL/VA: 3.55 ml/min/mmHg/L
DLCO unc % pred: 54 %
DLCO unc: 17.71 ml/min/mmHg
FEF 25-75 PRE: 0.5 L/s
FEF 25-75 Post: 0.72 L/sec
FEF2575-%CHANGE-POST: 43 %
FEF2575-%PRED-POST: 24 %
FEF2575-%PRED-PRE: 16 %
FEV1-%CHANGE-POST: 17 %
FEV1-%PRED-PRE: 29 %
FEV1-%Pred-Post: 35 %
FEV1-Post: 1.26 L
FEV1-Pre: 1.08 L
FEV1FVC-%Change-Post: 1 %
FEV1FVC-%PRED-PRE: 58 %
FEV6-%CHANGE-POST: 12 %
FEV6-%PRED-POST: 57 %
FEV6-%Pred-Pre: 50 %
FEV6-Post: 2.6 L
FEV6-Pre: 2.31 L
FEV6FVC-%Change-Post: 0 %
FEV6FVC-%Pred-Post: 101 %
FEV6FVC-%Pred-Pre: 100 %
FVC-%CHANGE-POST: 15 %
FVC-%PRED-POST: 58 %
FVC-%Pred-Pre: 50 %
FVC-POST: 2.8 L
FVC-Pre: 2.41 L
Post FEV1/FVC ratio: 45 %
Post FEV6/FVC ratio: 97 %
Pre FEV1/FVC ratio: 45 %
Pre FEV6/FVC Ratio: 96 %
RV % pred: 220 %
RV: 4.95 L
TLC % pred: 112 %
TLC: 7.9 L

## 2015-02-08 MED ORDER — TIOTROPIUM BROMIDE MONOHYDRATE 2.5 MCG/ACT IN AERS: 2.0000 | INHALATION_SPRAY | Freq: Every day | RESPIRATORY_TRACT | Status: DC

## 2015-02-08 MED ORDER — FLUTICASONE-SALMETEROL 500-50 MCG/DOSE IN AEPB: 1.0000 | INHALATION_SPRAY | Freq: Two times a day (BID) | RESPIRATORY_TRACT | Status: AC

## 2015-02-08 MED ORDER — ALBUTEROL SULFATE HFA 108 (90 BASE) MCG/ACT IN AERS: 1.0000 | INHALATION_SPRAY | RESPIRATORY_TRACT | Status: AC | PRN

## 2015-02-08 NOTE — Assessment & Plan Note (Addendum)
Tobacco Cessation - Counseling regarding benefits of smoking cessation strategies was provided for more than 12 min. - Educated that at this time smoking- cessation represents the single most important step that patient can take to enhance the length and quality of live. - Educated patient regarding alternatives of behavior interventions, pharmacotherapy including NRT and non-nicotine therapy such, and combinations of both. - Patient quit smoking 2 weeks ago - Patient encouraged to avoid tobacco and secondhand smoke

## 2015-02-08 NOTE — Patient Instructions (Addendum)
Follow up with Dr. Stevenson Clinch in 1 months - we will schedule you for a CPAP titration study, please get this done prior to follow up visit.  - Advair 500/50 - 1 puff in the AM and 1 puff in the PM - gargle and rinse after each use.  - Spiriva respimat - (2.5mg ) - 2 puffs in the AM -  -gargle and rinse after each use.  - avoid tobacco and all forms - Referral to pulmonary rehab - albuterol RESCUE inhaler - 2puff every 3-4 hours as needed for shortness of breath\wheezing\recurrent cough - diet, exercise, weight loss   Chronic Obstructive Pulmonary Disease Chronic obstructive pulmonary disease (COPD) is a common lung problem. In COPD, the flow of air from the lungs is limited. The way your lungs work will probably never return to normal, but there are things you can do to improve your lungs and make yourself feel better. HOME CARE  Take all medicines as told by your doctor.  Avoid medicines or cough syrups that dry up your airway (such as antihistamines) and do not allow you to get rid of thick spit. You do not need to avoid them if told differently by your doctor.  If you smoke, stop. Smoking makes the problem worse.  Avoid being around things that make your breathing worse (like smoke, chemicals, and fumes).  Use oxygen therapy and therapy to help improve your lungs (pulmonary rehabilitation) if told by your doctor. If you need home oxygen therapy, ask your doctor if you should buy a tool to measure your oxygen level (oximeter).  Avoid people who have a sickness you can catch (contagious).  Avoid going outside when it is very hot, cold, or humid.  Eat healthy foods. Eat smaller meals more often. Rest before meals.  Stay active, but remember to also rest.  Make sure to get all the shots (vaccines) your doctor recommends. Ask your doctor if you need a pneumonia shot.  Learn and use tips on how to relax.  Learn and use tips on how to control your breathing as told by your doctor.  Try:  Breathing in (inhaling) through your nose for 1 second. Then, pucker your lips and breath out (exhale) through your lips for 2 seconds.  Putting one hand on your belly (abdomen). Breathe in slowly through your nose for 1 second. Your hand on your belly should move out. Pucker your lips and breathe out slowly through your lips. Your hand on your belly should move in as you breathe out.  Learn and use controlled coughing to clear thick spit from your lungs. The steps are: 1. Lean your head a little forward. 2. Breathe in deeply. 3. Try to hold your breath for 3 seconds. 4. Keep your mouth slightly open while coughing 2 times. 5. Spit any thick spit out into a tissue. 6. Rest and do the steps again 1 or 2 times as needed. GET HELP IF:  You cough up more thick spit than usual.  There is a change in the color or thickness of the spit.  It is harder to breathe than usual.  Your breathing is faster than usual. GET HELP RIGHT AWAY IF:   You have shortness of breath while resting.  You have shortness of breath that stops you from:  Being able to talk.  Doing normal activities.  You chest hurts for longer than 5 minutes.  Your skin color is more blue than usual.  Your pulse oximeter shows that you have low  oxygen for longer than 5 minutes. MAKE SURE YOU:   Understand these instructions.  Will watch your condition.  Will get help right away if you are not doing well or get worse. Document Released: 12/04/2007 Document Revised: 11/01/2013 Document Reviewed: 02/11/2013 [facility] Patient Information 2015 [place], [place]. This information is not intended to replace advice given to you by your health care provider. Make sure you discuss any questions you have with your health care provider.

## 2015-02-08 NOTE — Progress Notes (Signed)
MRN# [id number] Hunter White May 21, 1956   CC: Chief Complaint  Patient presents with  . Follow-up    SMW/PFT results; feels well today; feel more SOB with heat and activity; not much cough      Brief History: 11/2014 HPI:  Patient is a pleasant 59 year old male presenting today for evaluation of COPD and affect and sleep apnea. He was recently seen at the surgical center for sebaceous cyst removal on his back, at that time he was noted to have apneic events and descending down to the lower 80s, this was performed on June 8, he was referred to pulmonary for further evaluation. Patient states that he's been having shortness of breath especially with exertion for the past 5-6 months around his doctor's visits he may have oxygen saturations in the mid to upper 80s and was advised to do breathing exercises which include purse lipped breathing, but he states this is been difficult for him due to his anxiety level. Endorses shortness of breath with exertion, cough (all the time) cough is mild productive of clear sputum. Patient states he is a long-time smoker, 2 packs per day for at least 20 years currently down to 67 cigarettes per day, no alcohol abuse, had a previous cocaine use (last used 10 years ago). Today patient is also accompanied by his daughter who states that he does have sleeping and snoring problems; see questionnaire below. Overall patient has been told he has COPD that has not been formally diagnosed with it, he denies any urgent care/emergency room visits or need for steroids in the last 2-3 years. He does endorse intermittent leg swelling (left greater than right) Plan: -Tobacco Cessation -Pulmonary function testing and 6 minute walk test -Weight loss and continue with exercise at the pool -Dietary and lifestyle modifications -Overnight pulse ox surgery study -Pursed lip breathing  Events since last clinic visit: Patient presents for follow up visit. Quit smoking 2  weeks ago. Heat makes dyspnea worst and activity. Activity is difficult also due to hip and chronic back pain issues.   Currently on 2L of O2 at night, this causes some sinus drainage (clear).    Medication:   Current Outpatient Rx  Name  Route  Sig  Dispense  Refill  . acetaminophen (TYLENOL) 650 MG CR tablet   Oral   Take 1,300 mg by mouth every 8 (eight) hours as needed for pain.         Marland Kitchen esomeprazole (NEXIUM) 40 MG capsule   Oral   Take 40 mg by mouth 2 (two) times daily before a meal.         . hydrochlorothiazide (HYDRODIURIL) 25 MG tablet   Oral   Take 25 mg by mouth daily.         Marland Kitchen lisinopril (PRINIVIL,ZESTRIL) 40 MG tablet   Oral   Take 40 mg by mouth daily.            Review of Systems: Gen:  Denies  fever, sweats, chills HEENT: Denies blurred vision, double vision, ear pain, eye pain, hearing loss, nose bleeds, sore throat Cvc:  No dizziness, chest pain or heaviness Resp:   Admits QA:STMHD of breath, not worst, mild non productive cough.  Gi: Denies swallowing difficulty, stomach pain, nausea or vomiting, diarrhea, constipation, bowel incontinence Gu:  Denies bladder incontinence, burning urine Ext:   No Joint pain, stiffness or swelling Skin: No skin rash, easy bruising or bleeding or hives Endoc:  No polyuria, polydipsia , polyphagia or weight change  Other:  All other systems negative  Allergies:  Review of patient's allergies indicates no known allergies.  Physical Examination:  VS: BP 120/70 mmHg  Pulse 80  Ht 5\' 10"  (1.778 m)  Wt 267 lb (121.11 kg)  BMI 38.31 kg/m2  SpO2 94%  General Appearance: No distress  HEENT: PERRLA, no ptosis, no other lesions noticed Pulmonary:normal breath sounds., diaphragmatic excursion normal.No wheezing, No rales   Cardiovascular:  Normal S1,S2.  No m/r/g.     Abdomen:Exam: Benign, Soft, non-tender, No masses  Skin:   warm, no rashes, no ecchymosis  Extremities: normal, no cyanosis, clubbing, warm with  normal capillary refill.     Pulmonary function testing 02/08/2015 FVC 50% FEV1 29% FEV1/FVC 45% RV 220 TLC 112 RV/TLC 189 DLCO uncorrected 54% Impression: Very severe obstruction with hyperinflation/air-trapping. Positive response to bronchodilation (+17% change). Moderate decrease in DLCO. [place] referred on flow loops.  6 min walk test: 192 m, no desaturations below 88%, highest heart rate 102.      Assessment and Plan: 59 year old male seen in follow-up for COPD optimization Tobacco abuse Tobacco Cessation - Counseling regarding benefits of smoking cessation strategies was provided for more than 12 min. - Educated that at this time smoking- cessation represents the single most important step that patient can take to enhance the length and quality of live. - Educated patient regarding alternatives of behavior interventions, pharmacotherapy including NRT and non-nicotine therapy such, and combinations of both. - Patient quit smoking 2 weeks ago - Patient encouraged to avoid tobacco and secondhand smoke   OSA (obstructive sleep apnea) PSG: 01/17/2015 -Severe obstructive sleep apnea, RDI 44.4 -Recommend CPAP titration  OSA  Discussed sleep data and reviewed with patient.  Encouraged proper weight management.  Excessive weight may contribute to snoring.  Monitor sedative use.  Discussed driving precautions and its relationship with hypersomnolence.  Discussed operating dangerous equipment and its relationship with hypersomnolence.  Discussed sleep hygiene, and benefits of a fixed sleep waked time.  The importance of getting eight or more hours of sleep discussed with patient.  Discussed limiting the use of the computer and television before bedtime.  Decrease naps during the day, so night time sleep will become enhanced.  Limit caffeine, and sleep deprivation.  HTN, stroke, and heart failure are potential risk factors.     Plan: CPAP titration  study    Hypoxia Patient with normal 6 minute walk test today, with no desaturations. He did have an overnight pulse oximetry study done that showed he will require 2 L of oxygen at night.    COPD (chronic obstructive pulmonary disease) Pulmonary function testing with severe, very severe, obstruction, FEV1 29%. I discussed the results with the patient and his daughter in great detail. Patient has stopped smoking for the past 2 weeks, states he has gained about 15-20 pounds since then.  Patient will require Advair, Spiriva, rescue inhaler, pulmonary rehabilitation.  Patient with other comorbidities such as obesity, hypertension, tobacco abuse. Controlling all his comorbidities will assist in his overall quality of life.  Plan: -Advair 504/50 -Spiriva Respimat -Albuterol rescue inhaler -Referral to pulmonary rehabilitation -Continue to avoid tobacco -Diet, exercise, weight loss.     Updated Medication List Outpatient Encounter Prescriptions as of 02/08/2015  Medication Sig  . acetaminophen (TYLENOL) 650 MG CR tablet Take 1,300 mg by mouth every 8 (eight) hours as needed for pain.  Marland Kitchen esomeprazole (NEXIUM) 40 MG capsule Take 40 mg by mouth 2 (two) times daily before a meal.  . hydrochlorothiazide (  HYDRODIURIL) 25 MG tablet Take 25 mg by mouth daily.  Marland Kitchen lisinopril (PRINIVIL,ZESTRIL) 40 MG tablet Take 40 mg by mouth daily.  . [DISCONTINUED] pregabalin (LYRICA) 75 MG capsule Take 75 mg by mouth daily.   No facility-administered encounter medications on file as of 02/08/2015.    Orders for this visit: No orders of the defined types were placed in this encounter.    Thank  you for the visitation and for allowing  [facility] to assist in the care of your patient. Our recommendations are noted above.  Please contact us if we can be of further service.  Vilinda Boehringer, MD [facility] Office Number: [phone]

## 2015-02-08 NOTE — Progress Notes (Signed)
PFT performed today. 

## 2015-02-08 NOTE — Progress Notes (Signed)
SMW performed today. 

## 2015-02-08 NOTE — Assessment & Plan Note (Signed)
Patient with normal 6 minute walk test today, with no desaturations. He did have an overnight pulse oximetry study done that showed he will require 2 L of oxygen at night.

## 2015-02-08 NOTE — Assessment & Plan Note (Signed)
Pulmonary function testing with severe, very severe, obstruction, FEV1 29%. I discussed the results with the patient and his daughter in great detail. Patient has stopped smoking for the past 2 weeks, states he has gained about 15-20 pounds since then.  Patient will require Advair, Spiriva, rescue inhaler, pulmonary rehabilitation.  Patient with other comorbidities such as obesity, hypertension, tobacco abuse. Controlling all his comorbidities will assist in his overall quality of life.  Plan: -Advair 504/50 -Spiriva Respimat -Albuterol rescue inhaler -Referral to pulmonary rehabilitation -Continue to avoid tobacco -Diet, exercise, weight loss.

## 2015-02-08 NOTE — Assessment & Plan Note (Signed)
PSG: 01/17/2015 -Severe obstructive sleep apnea, RDI 44.4 -Recommend CPAP titration  OSA  Discussed sleep data and reviewed with patient.  Encouraged proper weight management.  Excessive weight may contribute to snoring.  Monitor sedative use.  Discussed driving precautions and its relationship with hypersomnolence.  Discussed operating dangerous equipment and its relationship with hypersomnolence.  Discussed sleep hygiene, and benefits of a fixed sleep waked time.  The importance of getting eight or more hours of sleep discussed with patient.  Discussed limiting the use of the computer and television before bedtime.  Decrease naps during the day, so night time sleep will become enhanced.  Limit caffeine, and sleep deprivation.  HTN, stroke, and heart failure are potential risk factors.     Plan: CPAP titration study

## 2015-02-09 ENCOUNTER — Telehealth: Payer: Self-pay | Admitting: *Deleted

## 2015-02-09 NOTE — Telephone Encounter (Signed)
Received PA request for Spiriva Resp. PA initiated and faxed to [facility].  Rec ID# [id number] M Will await response.

## 2015-02-15 NOTE — Telephone Encounter (Signed)
OK to prescribe Spirivia handihaler first. Please inform patient, and educate on proper use of this device.

## 2015-02-15 NOTE — Telephone Encounter (Signed)
Pt's insurance denied the Spiriva Resp 2.38mcg. They pt must try Spiriva Handihaler. Is it ok for me to send to pharmacy? They states if pt fails Spiriva Handihaler then they will cover Spiriva Resp.

## 2015-02-16 MED ORDER — TIOTROPIUM BROMIDE MONOHYDRATE 18 MCG IN CAPS: 18.0000 ug | ORAL_CAPSULE | Freq: Every day | RESPIRATORY_TRACT | Status: DC

## 2015-02-16 NOTE — Telephone Encounter (Signed)
Sent Spiriva Handihaler to pharmacy per VM. Called pt and informed him of change and instructions on how to use handihaler. Pt verbalized understanding. Nothing further needed.

## 2015-02-17 ENCOUNTER — Ambulatory Visit: Payer: Medicaid Other | Attending: Pulmonary Disease

## 2015-02-17 DIAGNOSIS — G478 Other sleep disorders: Secondary | ICD-10-CM | POA: Diagnosis present

## 2015-02-17 DIAGNOSIS — Z9989 Dependence on other enabling machines and devices: Secondary | ICD-10-CM | POA: Insufficient documentation

## 2015-02-17 DIAGNOSIS — G4733 Obstructive sleep apnea (adult) (pediatric): Secondary | ICD-10-CM | POA: Insufficient documentation

## 2015-03-02 ENCOUNTER — Encounter: Payer: Self-pay | Admitting: Surgery

## 2015-03-02 ENCOUNTER — Encounter (INDEPENDENT_AMBULATORY_CARE_PROVIDER_SITE_OTHER): Payer: Self-pay

## 2015-03-02 ENCOUNTER — Ambulatory Visit (INDEPENDENT_AMBULATORY_CARE_PROVIDER_SITE_OTHER): Payer: Medicaid Other | Admitting: Surgery

## 2015-03-02 VITALS — BP 160/75 | HR 85 | Temp 98.0°F | Resp 20

## 2015-03-02 DIAGNOSIS — Q828 Other specified congenital malformations of skin: Secondary | ICD-10-CM

## 2015-03-02 MED ORDER — OXYCODONE-ACETAMINOPHEN 5-325 MG PO TABS: 1.0000 | ORAL_TABLET | ORAL | Status: DC | PRN

## 2015-03-02 NOTE — Progress Notes (Signed)
  Surgical Consultation  03/02/2015  Hunter White is an 59 y.o. male.   Chief Complaint  Patient presents with  . Skin Problem    procedure to remove skin tags     HPI: He has multiple skin tags under both axilla. His several on his neck. His pulmonary status is improved. He would like to consider elective excision.  Past Medical History  Diagnosis Date  . Hypertension   . Fibromyalgia   . COPD (chronic obstructive pulmonary disease)   . Depression   . GERD (gastroesophageal reflux disease)   . Arthritis     Past Surgical History  Procedure Laterality Date  . Cholecystectomy    . Knee arthroscopy Left   . Vasectomy    . Hernia repair    . Shoulder surgery Right   . Irrigation and debridement sebaceous cyst N/A 12/07/2014    Procedure: IRRIGATION AND DEBRIDEMENT SEBACEOUS CYST;  Surgeon: Dia Crawford III, MD;  Location: [facility] ORS;  Service: General;  Laterality: N/A;    Family History  Problem Relation Age of Onset  . Cancer Father     Social History:  reports that he quit smoking about 5 weeks ago. His smoking use included Cigarettes. He has a 8.75 pack-year smoking history. He quit smokeless tobacco use about 41 years ago. He reports that he does not drink alcohol or use illicit drugs.  Allergies: No Known Allergies  Medications reviewed.     ROS     BP 160/75 mmHg  Pulse 85  Temp(Src) 98 F (36.7 C) (Oral)  Resp 20  SpO2 93%  Physical Exam using Betadine prep sterile drapes Xylocaine buffered with sodium bicarbonate his left axilla and right neck were removed of all skin tags larger than 2 mm. He tolerated procedure well. Hemostasis was achieved with silver nitrate. Sterile dressings were applied to the larger areas.    No results found for this or any previous visit (from the past 48 hour(s)). No results found.  Assessment/Plan: 1. Accessory skin tags He had no particular problems with the procedure. We'll have him back as this or basis for  further removal. He is in agreement. His daughter was present for the exam and procedure.   Dia Crawford III dermatitis

## 2015-03-02 NOTE — Patient Instructions (Signed)
Follow up in office as needed 

## 2015-03-03 ENCOUNTER — Encounter (HOSPITAL_BASED_OUTPATIENT_CLINIC_OR_DEPARTMENT_OTHER): Payer: Medicaid Other | Admitting: Pulmonary Disease

## 2015-03-03 DIAGNOSIS — G4733 Obstructive sleep apnea (adult) (pediatric): Secondary | ICD-10-CM | POA: Diagnosis not present

## 2015-03-16 ENCOUNTER — Encounter: Payer: Self-pay | Admitting: Surgery

## 2015-03-22 ENCOUNTER — Encounter: Payer: Self-pay | Admitting: Internal Medicine

## 2015-03-22 ENCOUNTER — Other Ambulatory Visit: Payer: Self-pay | Admitting: Unknown Physician Specialty

## 2015-03-22 ENCOUNTER — Ambulatory Visit (INDEPENDENT_AMBULATORY_CARE_PROVIDER_SITE_OTHER): Payer: Medicaid Other | Admitting: Internal Medicine

## 2015-03-22 VITALS — BP 136/68 | HR 98 | Ht 70.0 in | Wt 280.0 lb

## 2015-03-22 DIAGNOSIS — R0600 Dyspnea, unspecified: Secondary | ICD-10-CM | POA: Diagnosis not present

## 2015-03-22 DIAGNOSIS — R600 Localized edema: Secondary | ICD-10-CM | POA: Diagnosis not present

## 2015-03-22 DIAGNOSIS — J449 Chronic obstructive pulmonary disease, unspecified: Secondary | ICD-10-CM | POA: Diagnosis not present

## 2015-03-22 DIAGNOSIS — G4733 Obstructive sleep apnea (adult) (pediatric): Secondary | ICD-10-CM

## 2015-03-22 NOTE — Progress Notes (Signed)
[facility] Consultation      MRN# [id number] Hunter White September 09, 1955   CC: Chief Complaint  Patient presents with  . Follow-up    CPAP titration; COPD f/u; SOB w/any activity at times while doing nothing; chets tightness; wheezing at times; cough, prod at times;  MMRC 2      Brief History: Patient is a pleasant 59 year old male with very severe COPD (FEV1 post BD 35%) and severe OSA (RDI 44.4).  Former smoker , 40 pack year, quit in 01/2015. On Advair/spiriva, cpap, 2L O2 at night.   ROV 02/08/15 Patient presents for follow up visit. Quit smoking 2 weeks ago. Heat makes dyspnea worst and activity. Activity is difficult also due to hip and chronic back pain issues.  Currently on 2L of O2 at night, this causes some sinus drainage (clear).   Plan: -Advair 500/50 -Spiriva Respimat -Albuterol rescue inhaler -Referral to pulmonary rehabilitation -Continue to avoid tobacco -Diet, exercise, weight loss.   Events since last clinic visit: Presents today for a follow-up visit, since his last visit he's had a CPAP titration study. His titration study showed that he requires 15 cm H2O. States that his breathing has improved since he stopped smoking. States that he is on 2 fluid pills but still having lower ext. Swelling.  Overall with good improvement in his breathing status. Medicaid denied his pulm rehab. Currently attending the [facility], doing water exercises, 5/7 days.  Has had some weight gain.       Medication:   Current Outpatient Rx  Name  Route  Sig  Dispense  Refill  . acetaminophen (TYLENOL) 650 MG CR tablet   Oral   Take 1,300 mg by mouth every 8 (eight) hours as needed for pain.         Marland Kitchen albuterol (PROVENTIL HFA;VENTOLIN HFA) 108 (90 BASE) MCG/ACT inhaler   Inhalation   Inhale 1-2 puffs into the lungs every 4 (four) hours as needed for wheezing or shortness of breath.   1 Inhaler   6   . esomeprazole (NEXIUM) 40 MG capsule   Oral  Take 40 mg by mouth 2 (two) times daily before a meal.         . Fluticasone-Salmeterol (ADVAIR) 500-50 MCG/DOSE AEPB   Inhalation   Inhale 1 puff into the lungs 2 (two) times daily.   60 each   4   . hydrochlorothiazide (HYDRODIURIL) 25 MG tablet   Oral   Take 25 mg by mouth daily.         Marland Kitchen lisinopril (PRINIVIL,ZESTRIL) 40 MG tablet   Oral   Take 40 mg by mouth daily.         Marland Kitchen oxyCODONE-acetaminophen (ROXICET) 5-325 MG per tablet   Oral   Take 1 tablet by mouth every 4 (four) hours as needed for severe pain.   30 tablet   0   . pregabalin (LYRICA) 50 MG capsule   Oral   Take 50 mg by mouth 2 (two) times daily.         Marland Kitchen tiotropium (SPIRIVA) 18 MCG inhalation capsule   Inhalation   Place 1 capsule (18 mcg total) into inhaler and inhale daily.   30 capsule   6      Review of Systems  Constitutional: Negative for fever and chills.  Eyes: Negative for blurred vision and double vision.  Respiratory: Positive for shortness of breath. Negative for cough, hemoptysis, sputum production and wheezing.  Chronic sob, but improving since stopped smoking  Cardiovascular: Positive for leg swelling and PND. Negative for chest pain and palpitations.  Gastrointestinal: Negative for heartburn, nausea and vomiting.  Skin: Negative for rash.  Neurological: Negative for headaches.  Endo/Heme/Allergies: Negative for environmental allergies. Does not bruise/bleed easily.      Allergies:  Review of patient's allergies indicates no known allergies.  Physical Examination:  VS: BP 136/68 mmHg  Pulse 98  Ht 5\' 10"  (1.778 m)  Wt 280 lb (127.007 kg)  BMI 40.18 kg/m2  SpO2 90%  General Appearance: No distress  HEENT: PERRLA, no ptosis, no other lesions noticed Pulmonary:good respiratory effort, dec bilateral BS sound at the bases  Cardiovascular:  Normal S1,S2.  No m/r/g.     Abdomen:Exam: Benign, Soft, non-tender, No masses  Skin:   warm, no rashes, no ecchymosis    Extremities: normal, no cyanosis, clubbing, warm with normal capillary refill.  2+ pitting edema Bilateral LE    Assessment and Plan: OSA (obstructive sleep apnea) PSG: 01/17/2015 Titration Study: 02/17/15 -Severe obstructive sleep apnea, RDI 44.4 - CPAP 15cm H20 with 2L O2 at night and with naps.   OSA  Discussed sleep data and reviewed with patient.  Encouraged proper weight management.  Excessive weight may contribute to snoring.  Monitor sedative use.  Discussed driving precautions and its relationship with hypersomnolence.  Discussed operating dangerous equipment and its relationship with hypersomnolence.  Discussed sleep hygiene, and benefits of a fixed sleep waked time.  The importance of getting eight or more hours of sleep discussed with patient.  Discussed limiting the use of the computer and television before bedtime.  Decrease naps during the day, so night time sleep will become enhanced.  Limit caffeine, and sleep deprivation.  HTN, stroke, and heart failure are potential risk factors.     Plan: CPAP 15cmH20      COPD (chronic obstructive pulmonary disease) Pulmonary function testing with severe, very severe, obstruction, FEV1 29%, postBD FEV1 35% I discussed the results with the patient and his daughter in great detail. Patient has stopped smoking for the past 2 months states he has gained about 20 pounds since then.  Patient will require Advair, Spiriva, rescue inhaler Medicaid denied his pulmonary rehab.  Has joined the [facility], and attending about 5/7 days, doing water exercises.   MMRC=2 Patient with other comorbidities such as obesity, hypertension, tobacco abuse. Controlling all his comorbidities will assist in his overall quality of life.  Plan: -Advair 504/50 -Spiriva Respimat -Albuterol rescue inhaler -Continue to avoid tobacco -Diet, exercise, weight loss.     Edema leg DDx - obesity, HTN, cardiac related  Plan - cont with diuresis as  dictated by PMD - will check 2D ECHO to evaluate for any cardiac dysfunction esp with comorbidities such as HTN, age, gender, obesity, OSA, COPD    Updated Medication List Outpatient Encounter Prescriptions as of 03/22/2015  Medication Sig  . acetaminophen (TYLENOL) 650 MG CR tablet Take 1,300 mg by mouth every 8 (eight) hours as needed for pain.  Marland Kitchen albuterol (PROVENTIL HFA;VENTOLIN HFA) 108 (90 BASE) MCG/ACT inhaler Inhale 1-2 puffs into the lungs every 4 (four) hours as needed for wheezing or shortness of breath.  . esomeprazole (NEXIUM) 40 MG capsule Take 40 mg by mouth 2 (two) times daily before a meal.  . Fluticasone-Salmeterol (ADVAIR) 500-50 MCG/DOSE AEPB Inhale 1 puff into the lungs 2 (two) times daily.  . hydrochlorothiazide (HYDRODIURIL) 25 MG tablet Take 25 mg by mouth 2 (two) times  daily.   . lisinopril (PRINIVIL,ZESTRIL) 40 MG tablet Take 40 mg by mouth daily.  . pregabalin (LYRICA) 50 MG capsule Take 50 mg by mouth 2 (two) times daily.  Marland Kitchen tiotropium (SPIRIVA) 18 MCG inhalation capsule Place 1 capsule (18 mcg total) into inhaler and inhale daily.  . [DISCONTINUED] oxyCODONE-acetaminophen (ROXICET) 5-325 MG per tablet Take 1 tablet by mouth every 4 (four) hours as needed for severe pain.   No facility-administered encounter medications on file as of 03/22/2015.    Orders for this visit: Orders Placed This Encounter  Procedures  . AMB REFERRAL FOR DME    Referral Priority:  Routine    Referral Type:  Durable Medical Equipment Purchase    Number of Visits Requested:  1  . ECHOCARDIOGRAM COMPLETE    BiLat LE edema    Standing Status: Future     Number of Occurrences:      Standing Expiration Date: 06/20/2016    Order Specific Question:  Where should this test be performed    Answer:  [facility]    Order Specific Question:  Complete or Limited study?    Answer:  Complete    Order Specific Question:  With Image Enhancing Agent or without Image Enhancing Agent?     Answer:  With Image Enhancing Agent    Order Specific Question:  Reason for exam-Echo    Answer:  Dyspnea  786.09 / R06.00    Order Specific Question:  Reason for exam-Echo    Answer:  Other - See Comments Section    Thank  you for the visitation and for allowing  [facility] to assist in the care of your patient. Our recommendations are noted above.  Please contact us if we can be of further service.  Vilinda Boehringer, MD [facility] Office Number: [phone]

## 2015-03-22 NOTE — Assessment & Plan Note (Addendum)
Pulmonary function testing with severe, very severe, obstruction, FEV1 29%, postBD FEV1 35% I discussed the results with the patient and his daughter in great detail. Patient has stopped smoking for the past 2 months states he has gained about 20 pounds since then.  Patient will require Advair, Spiriva, rescue inhaler Medicaid denied his pulmonary rehab.  Has joined the [facility], and attending about 5/7 days, doing water exercises.   MMRC=2 Patient with other comorbidities such as obesity, hypertension, tobacco abuse. Controlling all his comorbidities will assist in his overall quality of life.  Plan: -Advair 504/50 -Spiriva Respimat -Albuterol rescue inhaler -Continue to avoid tobacco -Diet, exercise, weight loss.

## 2015-03-22 NOTE — Assessment & Plan Note (Addendum)
PSG: 01/17/2015 Titration Study: 02/17/15 -Severe obstructive sleep apnea, RDI 44.4 - CPAP 15cm H20 with 2L O2 at night and with naps.   OSA  Discussed sleep data and reviewed with patient.  Encouraged proper weight management.  Excessive weight may contribute to snoring.  Monitor sedative use.  Discussed driving precautions and its relationship with hypersomnolence.  Discussed operating dangerous equipment and its relationship with hypersomnolence.  Discussed sleep hygiene, and benefits of a fixed sleep waked time.  The importance of getting eight or more hours of sleep discussed with patient.  Discussed limiting the use of the computer and television before bedtime.  Decrease naps during the day, so night time sleep will become enhanced.  Limit caffeine, and sleep deprivation.  HTN, stroke, and heart failure are potential risk factors.     Plan: CPAP 15cmH20

## 2015-03-22 NOTE — Patient Instructions (Addendum)
Follow up with Dr. Stevenson Clinch in:3 months - cont with your COPD inhalers - we will start you on CPAP, 15cmH20 at night and with naps. - cont with 2L at night - cont with water exercises at the [facility] - cont with diet, and healthy eating.  - 2D ECHO for leg swelling and shortness of breath

## 2015-03-22 NOTE — Assessment & Plan Note (Signed)
DDx - obesity, HTN, cardiac related  Plan - cont with diuresis as dictated by PMD - will check 2D ECHO to evaluate for any cardiac dysfunction esp with comorbidities such as HTN, age, gender, obesity, OSA, COPD

## 2015-03-24 ENCOUNTER — Encounter: Payer: Self-pay | Admitting: Surgery

## 2015-03-24 ENCOUNTER — Ambulatory Visit (INDEPENDENT_AMBULATORY_CARE_PROVIDER_SITE_OTHER): Payer: Medicaid Other | Admitting: Surgery

## 2015-03-24 VITALS — BP 174/83 | HR 90 | Temp 98.4°F | Ht 70.0 in | Wt 276.6 lb

## 2015-03-24 DIAGNOSIS — Q828 Other specified congenital malformations of skin: Secondary | ICD-10-CM

## 2015-03-24 MED ORDER — OXYCODONE-ACETAMINOPHEN 5-325 MG PO TABS: 1.0000 | ORAL_TABLET | ORAL | Status: DC | PRN

## 2015-03-24 NOTE — Progress Notes (Signed)
  Surgical Consultation  03/24/2015  Hunter White is an 59 y.o. male.   Chief Complaint  Patient presents with  . Skin Tags     HPI: He returns to remove several more skin tags. Primarily these from the right axilla right arm and left shoulder. He did very well from the last procedure. His pulmonary status is being monitored carefully and he has no new symptoms.  Past Medical History  Diagnosis Date  . Hypertension   . Fibromyalgia   . COPD (chronic obstructive pulmonary disease)   . Depression   . GERD (gastroesophageal reflux disease)   . Arthritis     Past Surgical History  Procedure Laterality Date  . Cholecystectomy    . Knee arthroscopy Left   . Vasectomy    . Hernia repair    . Shoulder surgery Right   . Irrigation and debridement sebaceous cyst N/A 12/07/2014    Procedure: IRRIGATION AND DEBRIDEMENT SEBACEOUS CYST;  Surgeon: Dia Crawford III, MD;  Location: [facility] ORS;  Service: General;  Laterality: N/A;    Family History  Problem Relation Age of Onset  . Cancer Father     Social History:  reports that he quit smoking about 8 weeks ago. His smoking use included Cigarettes. He has a 8.75 pack-year smoking history. He quit smokeless tobacco use about 41 years ago. He reports that he does not drink alcohol or use illicit drugs.  Allergies: No Known Allergies  Medications reviewed.     ROS     BP 174/83 mmHg  Pulse 90  Temp(Src) 98.4 F (36.9 C) (Oral)  Ht 5\' 10"  (1.778 m)  Wt 125.465 kg (276 lb 9.6 oz)  BMI 39.69 kg/m2  SpO2 91%  Physical Exam Using Betadine prep Xylocaine anesthesia each was injected and removed. They were not sent for pathology. Hemostasis was achieved with silver nitrate.   No results found for this or any previous visit (from the past 48 hour(s)). No results found.  Assessment/Plan: 1. Accessory skin tags He did well with the surgery. We'll see him back as necessary. We encouraged him to continue follow-up with his  pulmonologist with regard to his significant chronic lung disease. His daughter was present for the interview.   Dia Crawford III dermatitis

## 2015-03-24 NOTE — Patient Instructions (Signed)
Please call with any questions or concerns.  You may shower like normal in 24 hours.

## 2015-03-31 ENCOUNTER — Other Ambulatory Visit: Payer: Medicaid Other

## 2015-04-06 ENCOUNTER — Other Ambulatory Visit: Payer: Self-pay

## 2015-04-06 ENCOUNTER — Ambulatory Visit (INDEPENDENT_AMBULATORY_CARE_PROVIDER_SITE_OTHER): Payer: Medicaid Other

## 2015-04-06 DIAGNOSIS — R600 Localized edema: Secondary | ICD-10-CM | POA: Diagnosis not present

## 2015-04-06 DIAGNOSIS — R0600 Dyspnea, unspecified: Secondary | ICD-10-CM

## 2015-04-11 ENCOUNTER — Telehealth: Payer: Self-pay

## 2015-04-11 ENCOUNTER — Encounter: Payer: Self-pay | Admitting: Internal Medicine

## 2015-04-11 DIAGNOSIS — J449 Chronic obstructive pulmonary disease, unspecified: Secondary | ICD-10-CM

## 2015-04-11 NOTE — Telephone Encounter (Signed)
Oxygen line in for CPAP machine needs rx. Sister is calling

## 2015-04-11 NOTE — Telephone Encounter (Signed)
LMOM for EC-Jessica informing order has been placed to bleed in the O2 into the CPAP. Nothing further needed.

## 2015-04-20 ENCOUNTER — Telehealth: Payer: Self-pay | Admitting: *Deleted

## 2015-04-20 NOTE — Telephone Encounter (Signed)
Pt daughter calling stating that pt has lots of issues breathing outside of home He has oxygen at home.  They are wondering if pt can get a mobile oxygen The one at home is not mobile Please advise.

## 2015-04-20 NOTE — Telephone Encounter (Signed)
LM for EC informing that I was sending message to VM.    VM, please advise if we can order portable O2 for pt to take with him when he is out.

## 2015-04-21 NOTE — Telephone Encounter (Signed)
Pt and daughter are requesting another SMW. Ok per VM. appt scheduled. Nothing further needed.

## 2015-04-21 NOTE — Telephone Encounter (Signed)
LM to call me back. Will await call.

## 2015-04-21 NOTE — Telephone Encounter (Signed)
His last 6MWT showed he would not qualify for O2 with Ambulation.  We can try to retest him, or ask the DME for temporary supplemental O2 with ambulation (2L [place]).  -Dr Stevenson Clinch

## 2015-04-24 ENCOUNTER — Telehealth: Payer: Self-pay | Admitting: *Deleted

## 2015-04-24 ENCOUNTER — Ambulatory Visit (INDEPENDENT_AMBULATORY_CARE_PROVIDER_SITE_OTHER): Payer: Medicaid Other | Admitting: Internal Medicine

## 2015-04-24 DIAGNOSIS — J449 Chronic obstructive pulmonary disease, unspecified: Secondary | ICD-10-CM

## 2015-04-24 DIAGNOSIS — R0609 Other forms of dyspnea: Principal | ICD-10-CM

## 2015-04-24 NOTE — Telephone Encounter (Signed)
Yes order the 2L o2 with exertion. Also, order a 2 view cxr for dyspnea on exertion.  Thanks

## 2015-04-24 NOTE — Progress Notes (Signed)
SMW performed today. 

## 2015-04-24 NOTE — Telephone Encounter (Signed)
Pt came in for another SMW, He could only walk for about 2 minutes and I checked his O2 sats and he was 84%. I placed him on 2L O2 and he came up to 94%. Would you like for me to place the order for O2 with exertion?  I also had him to schedule appt because he is having trouble wearing his CPAP because of the feeling he has when it is on. The walk is under your name for today for you to chart and close. Thanks.

## 2015-04-25 NOTE — Addendum Note (Signed)
Addended by: Oscar La R on: 04/25/2015 09:06 AM   Modules accepted: Orders

## 2015-04-25 NOTE — Telephone Encounter (Signed)
Spoke with pt and informed he needed CXR and f/u appt ASAP. Pt scheduled and appt made. Nothing further needed.

## 2015-04-26 ENCOUNTER — Ambulatory Visit
Admission: RE | Admit: 2015-04-26 | Discharge: 2015-04-26 | Disposition: A | Discharge status: Home / Self Care | Payer: Medicaid Other | Source: Ambulatory Visit | Attending: Internal Medicine | Admitting: Internal Medicine

## 2015-04-26 ENCOUNTER — Ambulatory Visit
Admission: RE | Admit: 2015-04-26 | Discharge: 2015-04-26 | Disposition: A | Discharge status: Home / Self Care | Payer: Medicaid Other | Source: Ambulatory Visit | Attending: Pulmonary Disease | Admitting: Pulmonary Disease

## 2015-04-26 ENCOUNTER — Encounter: Payer: Self-pay | Admitting: Pulmonary Disease

## 2015-04-26 ENCOUNTER — Ambulatory Visit (INDEPENDENT_AMBULATORY_CARE_PROVIDER_SITE_OTHER): Payer: Medicaid Other | Admitting: Pulmonary Disease

## 2015-04-26 VITALS — BP 130/62 | HR 73 | Ht 70.0 in | Wt 282.0 lb

## 2015-04-26 DIAGNOSIS — Z720 Tobacco use: Secondary | ICD-10-CM | POA: Diagnosis not present

## 2015-04-26 DIAGNOSIS — R0609 Other forms of dyspnea: Secondary | ICD-10-CM | POA: Diagnosis not present

## 2015-04-26 DIAGNOSIS — J449 Chronic obstructive pulmonary disease, unspecified: Secondary | ICD-10-CM | POA: Insufficient documentation

## 2015-04-26 DIAGNOSIS — J441 Chronic obstructive pulmonary disease with (acute) exacerbation: Secondary | ICD-10-CM

## 2015-04-26 DIAGNOSIS — G4733 Obstructive sleep apnea (adult) (pediatric): Secondary | ICD-10-CM

## 2015-04-26 DIAGNOSIS — R0902 Hypoxemia: Secondary | ICD-10-CM | POA: Diagnosis not present

## 2015-04-26 DIAGNOSIS — F17200 Nicotine dependence, unspecified, uncomplicated: Secondary | ICD-10-CM

## 2015-04-26 MED ORDER — PREDNISONE 20 MG PO TABS: 40.0000 mg | ORAL_TABLET | Freq: Every day | ORAL | Status: AC

## 2015-04-26 MED ORDER — DOXYCYCLINE HYCLATE 100 MG PO TABS: 100.0000 mg | ORAL_TABLET | Freq: Two times a day (BID) | ORAL | Status: DC

## 2015-04-26 NOTE — Patient Instructions (Signed)
Doxycycline 100 mg twice a day for 7 days Prednisone 40 mg daily for 5 days Instructed to wear oxygen as close to 24 hrs per day as possible Congratulations on the smoking cessation. Keep up the good work. Continue attempts to wear CPAP with sleep. If/when unable to wear it, wear oxygen by nasal cannula When you follow up with Dr Stevenson Clinch, you and he might discuss changing from a full CPAP mask to a nasal mask with a chin strap

## 2015-04-26 NOTE — Progress Notes (Signed)
PROBLEMS: COPD, asthmatic bronchitis OSA  SUBJ: Pt is followed by Dr Stevenson Clinch. His initial evaluation included ambulatory oximetry which revealed no desaturation. Last week, however, he had worsening dyspnea and LE edema and ambulatory oximetry was repeated which revealed desaturation into the mid-80s. He is seen today as an acute visit for the same. He describes increased DOE. He has cough productive of scant yellow-green mucus. He denies F/C/S, hemoptysis and anginal chest pain but does have a sense of chest tightness. He has been instructed to try to restrict his use of albuterol MDI and has only used it a couple of times with some relief.   He is having difficult tolerating the nocturnal CPAP due to discomfort associated with a full face mask and is only wearing it 1-2 hrs per night. When not wearing the CPAP, he does use [place] O2 with sleep  OBJ: Plethoric No overt respiratory distress HEENT without acute abnormalities JVP cannot be visualized, no LAN BS are markedly diminished with scattered distant wheezes HS are distant, regualr without murmur noted Abd is obese, soft, NT, +BS Extremities reveal 1+ symmetric ankle and pretibial edema   IMPRESSION: COPD exacerbation  Worsening hypoxemia LE edema - likely cor pulmonale due to worsening hypoxemia Smoker - has not smoked X 6-8 weeks OSA (obstructive sleep apnea) - difficulty tolerating full face mask   PLAN: Doxycycline 100 mg twice a day for 7 days Prednisone 40 mg daily for 5 days More liberal use of albuterol MDI until back to his baseline Instructed to wear oxygen as close to 24 hrs per day as possible Congratulated on the smoking cessation. Encouraged to continue with these efforts Continue attempts to wear CPAP with sleep. If/when unable to wear it, wear oxygen by nasal cannula Follow up with Dr Stevenson Clinch already scheduled 05/16/15. Might discuss changing from a full CPAP mask to a nasal mask with a chin strap  Wilhelmina Mcardle,  MD [facility]

## 2015-04-28 ENCOUNTER — Other Ambulatory Visit: Payer: Self-pay | Admitting: Internal Medicine

## 2015-04-28 ENCOUNTER — Telehealth: Payer: Self-pay | Admitting: *Deleted

## 2015-04-28 DIAGNOSIS — J449 Chronic obstructive pulmonary disease, unspecified: Secondary | ICD-10-CM

## 2015-04-28 NOTE — Telephone Encounter (Signed)
Pt's daughter Janett Billow called [phone]) stating the pt got the portable tanks w/the roller. Pt states it is to heavy for him to "drag around due to his breathing and not being able to walk well. They ask if we could place an order for the Smart One, like the [employer]. Order placed. Nothing further needed.

## 2015-05-09 ENCOUNTER — Encounter: Payer: Medicaid Other | Attending: Internal Medicine | Admitting: Respiratory Therapy

## 2015-05-09 VITALS — Ht 70.0 in | Wt 277.0 lb

## 2015-05-09 DIAGNOSIS — J449 Chronic obstructive pulmonary disease, unspecified: Secondary | ICD-10-CM | POA: Diagnosis present

## 2015-05-09 NOTE — Patient Instructions (Signed)
Patient Instructions  Patient Details  Name: Hunter White MRN: [id number] Date of Birth: 1955/07/30 Referring Provider:  Vilinda Boehringer, MD  Below are the personal goals you chose as well as exercise and nutrition goals. Our goal is to help you keep on track towards obtaining and maintaining your goals. We will be discussing your progress on these goals with you throughout the program.  Initial Exercise Prescription:     Initial Exercise Prescription - 05/09/15 1400    Date of Initial Exercise Prescription   Date 05/09/15   Treadmill   MPH 2   Grade 0   Minutes 10   Recumbant Bike   Level 2   RPM 40   Watts 20   Minutes 10   NuStep   Level 2   Watts 40   Minutes 10   Arm Ergometer   Level 1   Watts 10   Minutes 10   Recumbant Elliptical   Level 2   RPM 40   Watts 20   Minutes 10   REL-XR   Level 2   Watts 40   Minutes 10   Prescription Details   Frequency (times per week) 3   Duration Progress to 30 minutes of continuous aerobic without signs/symptoms of physical distress   Intensity   THRR REST +  30   Ratings of Perceived Exertion 11-15   Perceived Dyspnea 2-4   Progression Continue progressive overload as per policy without signs/symptoms or physical distress.   Resistance Training   Training Prescription Yes   Weight 2   Reps 10-15      Exercise Goals: Frequency: Be able to perform aerobic exercise three times per week working toward 3-5 days per week.  Intensity: Work with a perceived exertion of 11 (fairly light) - 15 (hard) as tolerated. Follow your new exercise prescription and watch for changes in prescription as you progress with the program. Changes will be reviewed with you when they are made.  Duration: You should be able to do 30 minutes of continuous aerobic exercise in addition to a 5 minute warm-up and a 5 minute cool-down routine.  Nutrition Goals: Your personal nutrition goals will be established when you do your nutrition  analysis with the dietician.  The following are nutrition guidelines to follow: Cholesterol < 200mg /day Sodium < 1500mg /day Fiber: Men over 50 yrs - 30 grams per day  Personal Goals:     Personal Goals and Risk Factors at Admission - 05/09/15 0900    Personal Goals and Risk Factors on Admission    Weight Management Yes   Intervention Learn and follow the exercise and diet guidelines while in the program. Utilize the nutrition and education classes to help gain knowledge of the diet and exercise expectations in the program  Plans to meet with dietitian; likes fried foods and snacks all day, especially since he stopped smoking; needs encouragement to drink more fluids - only 3 glasses of water/day, some coffee   Admit Weight 277 lb (125.646 kg)   Goal Weight 200 lb (90.719 kg)   Increase Aerobic Exercise and Physical Activity Yes   Intervention While in program, learn and follow the exercise prescription taught. Start at a low level workload and increase workload after able to maintain previous level for 30 minutes. Increase time before increasing intensity.  Hunter White would like to be more active, especially daily activites. He has a [employer] and in the past, he exercised in the pool - walking and swimming,  because less pain.   Understand more about Heart/Pulmonary Disease. Yes   Intervention --  Hunter White has COPD and CHF and would like tolearn more about these diseases and management.    Improve shortness of breath with ADL's Yes   Intervention While in program, learn and follow the exercise prescription taught. Start at a low level workload and increase workload ad advised by the exercise physiologist. Increase time before increasing intensity.  Hunter White has severe shortness of breath with activity . His SOB questionnaire was scored high at 78.   Develop more efficient breathing techniques such as purse lipped breathing and diaphragmatic breathing; and practicing  self-pacing with activity Yes   Intervention While in program, learn and utilize the specific breathing techniques taught to you. Continue to practice and use the techniques as needed.  Hunter White has been using PLB, but did not know it was a named technique. I encouraged him to use PLB with activity in class and at home.   Increase knowledge of respiratory medications and ability to use respiratory devices properly.  Yes   Intervention While in program learn and demonstrate appropriate use of your oxygen therapy by increasing flow with exertion, manage oxygen tank operation, including continuous and intermittent flow.  Understanding oxygen is a drug ordered by your physician.;While in program, learn to administer MDI, nebulizer, and spacer properly.;Learn to take respiratory medicine as ordered.;While in program, learn to Clean MDI, nebulizers, and spacers properly.  Hunter White takes Advair, Spiriva, and Proair. I educated him on a spacer, but will give him one when ordered. He wears 2l/m Oxygen and uses a small gas portable tank from [employer]. He has CPAP, but cannot tolerate a full mask  - plans to change  mask.   Hypertension Yes   Goal Participant will see blood pressure controlled within the values of 140/8mm/Hg or within value directed by their physician.   Intervention Provide nutrition & aerobic exercise along with prescribed medications to achieve BP 140/90 or less.      Tobacco Use Initial Evaluation: History  Smoking status  . Former Smoker -- 0.25 packs/day for 35 years  . Types: Cigarettes  . Quit date: 01/26/2015  Smokeless tobacco  . Former Systems developer  . Quit date: 11/22/1973    Copy of goals given to participant.

## 2015-05-09 NOTE — Progress Notes (Signed)
Pulmonary Individual Treatment Plan  Patient Details  Name: CRAYTON SAVARESE MRN: [id number] Date of Birth: [birth year] Referring Provider:  Vilinda Boehringer, MD  Initial Encounter Date: Date: 05/09/15  Visit Diagnosis: COPD, severe ([place])  Patient's Home Medications on Admission:  Current outpatient prescriptions:  .  acetaminophen (TYLENOL) 650 MG CR tablet, Take 1,300 mg by mouth every 8 (eight) hours as needed for pain., Disp: , Rfl:  .  albuterol (PROVENTIL HFA;VENTOLIN HFA) 108 (90 BASE) MCG/ACT inhaler, Inhale 1-2 puffs into the lungs every 4 (four) hours as needed for wheezing or shortness of breath., Disp: 1 Inhaler, Rfl: 6 .  doxycycline (VIBRA-TABS) 100 MG tablet, Take 1 tablet (100 mg total) by mouth 2 (two) times daily., Disp: 14 tablet, Rfl: 0 .  esomeprazole (NEXIUM) 40 MG capsule, Take 40 mg by mouth 2 (two) times daily before a meal., Disp: , Rfl:  .  Fluticasone-Salmeterol (ADVAIR) 500-50 MCG/DOSE AEPB, Inhale 1 puff into the lungs 2 (two) times daily., Disp: 60 each, Rfl: 4 .  hydrochlorothiazide (HYDRODIURIL) 25 MG tablet, Take 25 mg by mouth 2 (two) times daily. , Disp: , Rfl:  .  lisinopril (PRINIVIL,ZESTRIL) 40 MG tablet, Take 40 mg by mouth daily., Disp: , Rfl:  .  pregabalin (LYRICA) 50 MG capsule, Take 50 mg by mouth 2 (two) times daily., Disp: , Rfl:  .  tiotropium (SPIRIVA) 18 MCG inhalation capsule, Place 1 capsule (18 mcg total) into inhaler and inhale daily., Disp: 30 capsule, Rfl: 6  Past Medical History: Past Medical History  Diagnosis Date  . Hypertension   . Fibromyalgia   . COPD (chronic obstructive pulmonary disease) ([place])   . Depression   . GERD (gastroesophageal reflux disease)   . Arthritis     Tobacco Use: History  Smoking status  . Former Smoker -- 0.25 packs/day for 35 years  . Types: Cigarettes  . Quit date: 01/26/2015  Smokeless tobacco  . Former Systems developer  . Quit date: 11/22/1973    Labs: Recent Review Flowsheet Data    There is no  flowsheet data to display.       ADL UCSD:     ADL UCSD      05/09/15 0900       ADL UCSD   ADL Phase Entry     SOB Score total 78     Rest 1     Walk 5     Stairs 5     Bath 5     Dress 5     Shop 5         Pulmonary Function Assessment:     Pulmonary Function Assessment - 05/09/15 0900    Pulmonary Function Tests   RV% 220 %   DLCO% 54 %   Initial Spirometry Results   FVC% 50 %   FEV1% 29 %   FEV1/FVC Ratio 45   Post Bronchodilator Spirometry Results   FVC% 58 %   FEV1% 35 %   FEV1/FVC Ratio 45   Breath   Bilateral Breath Sounds Clear;Decreased   Shortness of Breath Yes;Fear of Shortness of Breath;Limiting activity      Exercise Target Goals: Date: 05/09/15  Exercise Program Goal: Individual exercise prescription set with THRR, safety & activity barriers. Participant demonstrates ability to understand and report RPE using BORG scale, to self-measure pulse accurately, and to acknowledge the importance of the exercise prescription.  Exercise Prescription Goal: Starting with aerobic activity 30 plus minutes a day, 3 days per week for  initial exercise prescription. Provide home exercise prescription and guidelines that participant acknowledges understanding prior to discharge.  Activity Barriers & Risk Stratification:     Activity Barriers & Risk Stratification - 05/09/15 0900    Activity Barriers & Risk Stratification   Activity Barriers Joint Problems;Fibromyalgia;Deconditioning;Muscular Weakness;Shortness of Breath   Risk Stratification High      6 Minute Walk:     6 Minute Walk      05/09/15 1415       6 Minute Walk   Phase Initial     Distance 400 feet     Walk Time 4 minutes     Resting HR 91 bpm     Resting BP 146/76 mmHg     Max Ex. HR 118 bpm     Max Ex. BP 182/80 mmHg     RPE 18     Perceived Dyspnea  8     Symptoms Yes (comment)     Comments SOB, fatigue        Initial Exercise Prescription:     Initial Exercise  Prescription - 05/09/15 1400    Date of Initial Exercise Prescription   Date 05/09/15   Treadmill   MPH 2   Grade 0   Minutes 10   Recumbant Bike   Level 2   RPM 40   Watts 20   Minutes 10   NuStep   Level 2   Watts 40   Minutes 10   Arm Ergometer   Level 1   Watts 10   Minutes 10   Recumbant Elliptical   Level 2   RPM 40   Watts 20   Minutes 10   REL-XR   Level 2   Watts 40   Minutes 10   Prescription Details   Frequency (times per week) 3   Duration Progress to 30 minutes of continuous aerobic without signs/symptoms of physical distress   Intensity   THRR REST +  30   Ratings of Perceived Exertion 11-15   Perceived Dyspnea 2-4   Progression Continue progressive overload as per policy without signs/symptoms or physical distress.   Resistance Training   Training Prescription Yes   Weight 2   Reps 10-15      Exercise Prescription Changes:   Discharge Exercise Prescription (Final Exercise Prescription Changes):    Nutrition:  Target Goals: Understanding of nutrition guidelines, daily intake of sodium 1500mg , cholesterol 200mg , calories 30% from fat and 7% or less from saturated fats, daily to have 5 or more servings of fruits and vegetables.  Biometrics:     Pre Biometrics - 05/09/15 1422    Pre Biometrics   Height 5\' 10"  (1.778 m)   Weight 277 lb (125.646 kg)   Waist Circumference 50.5 inches   Hip Circumference 50.5 inches   Waist to Hip Ratio 1 %   BMI (Calculated) 39.8       Nutrition Therapy Plan and Nutrition Goals:     Nutrition Therapy & Goals - 05/09/15 0900    Nutrition Therapy   Diet Plans to meet with dietitian; likes fried foods and snacks all day, especially since he stopped smoking; needs encouragement to drink more fluids - only 3 glasses of water/day, some coffee      Nutrition Discharge: Rate Your Plate Scores:   Psychosocial: Target Goals: Acknowledge presence or absence of depression, maximize coping skills, provide  positive support system. Participant is able to verbalize types and ability to use techniques and skills needed for  reducing stress and depression.  Initial Review & Psychosocial Screening:     Initial Psych Review & Screening - 05/09/15 0900    Initial Review   Current issues with History of Depression   Family Dynamics   Good Support System? Yes   Comments Mr Simson has a history of depression and has counciled with therapists. He has good support from his daughter and mother and has his [place] faith. He has a history of past drug use and chronic pain.   Barriers   Psychosocial barriers to participate in program The patient should benefit from training in stress management and relaxation.   Screening Interventions   Interventions Encouraged to exercise;Program counselor consult      Quality of Life Scores:     Quality of Life - 05/09/15 0900    Quality of Life Scores   Health/Function Pre 5.56 %   Socioeconomic Pre 13.71 %   Psych/Spiritual Pre 6.29 %   Family Pre 22.5 %   GLOBAL Pre 9.38 %      PHQ-9:     Recent Review Flowsheet Data    Depression screen [facility] 2/9 05/09/2015   Decreased Interest 3   Down, Depressed, Hopeless 3   PHQ - 2 Score 6   Altered sleeping 3   Tired, decreased energy 3   Change in appetite 3   Feeling bad or failure about yourself  3   Trouble concentrating 1   Moving slowly or fidgety/restless 1   Suicidal thoughts 0   PHQ-9 Score 20   Difficult doing work/chores Very difficult      Psychosocial Evaluation and Intervention:   Psychosocial Re-Evaluation:  Education: Education Goals: Education classes will be provided on a weekly basis, covering required topics. Participant will state understanding/return demonstration of topics presented.  Learning Barriers/Preferences:     Learning Barriers/Preferences - 05/09/15 0900    Learning Barriers/Preferences   Learning Barriers None   Learning Preferences Group  Instruction;Individual Instruction;Pictoral;Skilled Demonstration;Verbal Instruction;Video;Written Material      Education Topics: Initial Evaluation Education: - Verbal, written and demonstration of respiratory meds, RPE/PD scales, oximetry and breathing techniques. Instruction on use of nebulizers and MDIs: cleaning and proper use, rinsing mouth with steroid doses and importance of monitoring MDI activations.          Pulmonary Rehab from 05/09/2015 in [place]   Date  05/09/15   Educator  LB   Instruction Review Code  2- meets goals/outcomes      General Nutrition Guidelines/Fats and Fiber: -Group instruction provided by verbal, written material, models and posters to present the general guidelines for heart healthy nutrition. Gives an explanation and review of dietary fats and fiber.   Controlling Sodium/Reading Food Labels: -Group verbal and written material supporting the discussion of sodium use in heart healthy nutrition. Review and explanation with models, verbal and written materials for utilization of the food label.   Exercise Physiology & Risk Factors: - Group verbal and written instruction with models to review the exercise physiology of the cardiovascular system and associated critical values. Details cardiovascular disease risk factors and the goals associated with each risk factor.   Aerobic Exercise & Resistance Training: - Gives group verbal and written discussion on the health impact of inactivity. On the components of aerobic and resistive training programs and the benefits of this training and how to safely progress through these programs.   Flexibility, Balance, General Exercise Guidelines: - Provides group verbal and written instruction on  the benefits of flexibility and balance training programs. Provides general exercise guidelines with specific guidelines to those with heart or lung disease. Demonstration and skill  practice provided.   Stress Management: - Provides group verbal and written instruction about the health risks of elevated stress, cause of high stress, and healthy ways to reduce stress.   Depression: - Provides group verbal and written instruction on the correlation between heart/lung disease and depressed mood, treatment options, and the stigmas associated with seeking treatment.   Exercise & Equipment Safety: - Individual verbal instruction and demonstration of equipment use and safety with use of the equipment.   Infection Prevention: - Provides verbal and written material to individual with discussion of infection control including proper hand washing and proper equipment cleaning during exercise session.   Falls Prevention: - Provides verbal and written material to individual with discussion of falls prevention and safety.      Pulmonary Rehab from 05/09/2015 in [place]   Date  05/09/15   Educator  LB   Instruction Review Code  2- meets goals/outcomes      Diabetes: - Individual verbal and written instruction to review signs/symptoms of diabetes, desired ranges of glucose level fasting, after meals and with exercise. Advice that pre and post exercise glucose checks will be done for 3 sessions at entry of program.   Chronic Lung Diseases: - Group verbal and written instruction to review new updates, new respiratory medications, new advancements in procedures and treatments. Provide informative websites and "800" numbers of self-education.   Lung Procedures: - Group verbal and written instruction to describe testing methods done to diagnose lung disease. Review the outcome of test results. Describe the treatment choices: Pulmonary Function Tests, ABGs and oximetry.   Energy Conservation: - Provide group verbal and written instruction for methods to conserve energy, plan and organize activities. Instruct on pacing techniques, use of  adaptive equipment and posture/positioning to relieve shortness of breath.   Triggers: - Group verbal and written instruction to review types of environmental controls: home humidity, furnaces, filters, dust mite/pet prevention, HEPA vacuums. To discuss weather changes, air quality and the benefits of nasal washing.   Exacerbations: - Group verbal and written instruction to provide: warning signs, infection symptoms, calling MD promptly, preventive modes, and value of vaccinations. Review: effective airway clearance, coughing and/or vibration techniques. Create an Sports administrator.   Oxygen: - Individual and group verbal and written instruction on oxygen therapy. Includes supplement oxygen, available portable oxygen systems, continuous and intermittent flow rates, oxygen safety, concentrators, and Medicare reimbursement for oxygen.      Pulmonary Rehab from 05/09/2015 in [place]   Date  05/09/15   Educator  LB   Instruction Review Code  2- meets goals/outcomes      Respiratory Medications: - Group verbal and written instruction to review medications for lung disease. Drug class, frequency, complications, importance of spacers, rinsing mouth after steroid MDI's, and proper cleaning methods for nebulizers.      Pulmonary Rehab from 05/09/2015 in [place]   Date  05/09/15   Educator  LB   Instruction Review Code  2- meets goals/outcomes      AED/CPR: - Group verbal and written instruction with the use of models to demonstrate the basic use of the AED with the basic ABC's of resuscitation.   Breathing Retraining: - Provides individuals verbal and written instruction on purpose, frequency, and proper technique of diaphragmatic  breathing and pursed-lipped breathing. Applies individual practice skills.      Pulmonary Rehab from 05/09/2015 in [place]   Date  05/09/15    Educator  LB   Instruction Review Code  2- meets goals/outcomes      Anatomy and Physiology of the Lungs: - Group verbal and written instruction with the use of models to provide basic lung anatomy and physiology related to function, structure and complications of lung disease.   Heart Failure: - Group verbal and written instruction on the basics of heart failure: signs/symptoms, treatments, explanation of ejection fraction, enlarged heart and cardiomyopathy.   Sleep Apnea: - Individual verbal and written instruction to review Obstructive Sleep Apnea. Review of risk factors, methods for diagnosing and types of masks and machines for OSA.      Pulmonary Rehab from 05/09/2015 in [place]   Date  05/09/15   Educator  LB   Instruction Review Code  2- meets goals/outcomes      Anxiety: - Provides group, verbal and written instruction on the correlation between heart/lung disease and anxiety, treatment options, and management of anxiety.   Relaxation: - Provides group, verbal and written instruction about the benefits of relaxation for patients with heart/lung disease. Also provides patients with examples of relaxation techniques.   Knowledge Questionnaire Score:     Knowledge Questionnaire Score - 05/09/15 0900    Knowledge Questionnaire Score   Pre Score -3      Personal Goals and Risk Factors at Admission:     Personal Goals and Risk Factors at Admission - 05/09/15 0900    Personal Goals and Risk Factors on Admission    Weight Management Yes   Intervention Learn and follow the exercise and diet guidelines while in the program. Utilize the nutrition and education classes to help gain knowledge of the diet and exercise expectations in the program  Plans to meet with dietitian; likes fried foods and snacks all day, especially since he stopped smoking; needs encouragement to drink more fluids - only 3 glasses of water/day, some coffee    Admit Weight 277 lb (125.646 kg)   Goal Weight 200 lb (90.719 kg)   Increase Aerobic Exercise and Physical Activity Yes   Intervention While in program, learn and follow the exercise prescription taught. Start at a low level workload and increase workload after able to maintain previous level for 30 minutes. Increase time before increasing intensity.  Mr Soter would like to be more active, especially daily activites. He has a [employer] and in the past, he exercised in the pool - walking and swimming, because less pain.   Understand more about Heart/Pulmonary Disease. Yes   Intervention --  Mr Ferris has COPD and CHF and would like tolearn more about these diseases and management.    Improve shortness of breath with ADL's Yes   Intervention While in program, learn and follow the exercise prescription taught. Start at a low level workload and increase workload ad advised by the exercise physiologist. Increase time before increasing intensity.  Mr Lyles has severe shortness of breath with activity . His SOB questionnaire was scored high at 78.   Develop more efficient breathing techniques such as purse lipped breathing and diaphragmatic breathing; and practicing self-pacing with activity Yes   Intervention While in program, learn and utilize the specific breathing techniques taught to you. Continue to practice and use the techniques as needed.  Mr South has been using  PLB, but did not know it was a named technique. I encouraged him to use PLB with activity in class and at home.   Increase knowledge of respiratory medications and ability to use respiratory devices properly.  Yes   Intervention While in program learn and demonstrate appropriate use of your oxygen therapy by increasing flow with exertion, manage oxygen tank operation, including continuous and intermittent flow.  Understanding oxygen is a drug ordered by your physician.;While in program, learn to administer MDI,  nebulizer, and spacer properly.;Learn to take respiratory medicine as ordered.;While in program, learn to Clean MDI, nebulizers, and spacers properly.  Mr Tribby takes Advair, Spiriva, and Proair. I educated him on a spacer, but will give him one when ordered. He wears 2l/m Oxygen and uses a small gas portable tank from [employer]. He has CPAP, but cannot tolerate a full mask  - plans to change  mask.   Hypertension Yes   Goal Participant will see blood pressure controlled within the values of 140/31mm/Hg or within value directed by their physician.   Intervention Provide nutrition & aerobic exercise along with prescribed medications to achieve BP 140/90 or less.      Personal Goals and Risk Factors Review:    Personal Goals Discharge (Final Personal Goals and Risk Factors Review):    Comments:

## 2015-05-10 ENCOUNTER — Encounter: Payer: Self-pay | Admitting: Respiratory Therapy

## 2015-05-10 NOTE — Progress Notes (Signed)
Pulmonary Individual Treatment Plan  Patient Details  Name: Hunter White MRN: [id number] Date of Birth: 1956/05/02 Referring Provider:  Dr Vilinda Boehringer  Initial Encounter Date: 05/09/2015  Visit Diagnosis: COPD Severe  Patient's Home Medications on Admission:  Current outpatient prescriptions:    acetaminophen (TYLENOL) 650 MG CR tablet, Take 1,300 mg by mouth every 8 (eight) hours as needed for pain., Disp: , Rfl:    albuterol (PROVENTIL HFA;VENTOLIN HFA) 108 (90 BASE) MCG/ACT inhaler, Inhale 1-2 puffs into the lungs every 4 (four) hours as needed for wheezing or shortness of breath., Disp: 1 Inhaler, Rfl: 6   doxycycline (VIBRA-TABS) 100 MG tablet, Take 1 tablet (100 mg total) by mouth 2 (two) times daily., Disp: 14 tablet, Rfl: 0   esomeprazole (NEXIUM) 40 MG capsule, Take 40 mg by mouth 2 (two) times daily before a meal., Disp: , Rfl:    Fluticasone-Salmeterol (ADVAIR) 500-50 MCG/DOSE AEPB, Inhale 1 puff into the lungs 2 (two) times daily., Disp: 60 each, Rfl: 4   hydrochlorothiazide (HYDRODIURIL) 25 MG tablet, Take 25 mg by mouth 2 (two) times daily. , Disp: , Rfl:    lisinopril (PRINIVIL,ZESTRIL) 40 MG tablet, Take 40 mg by mouth daily., Disp: , Rfl:    pregabalin (LYRICA) 50 MG capsule, Take 50 mg by mouth 2 (two) times daily., Disp: , Rfl:    tiotropium (SPIRIVA) 18 MCG inhalation capsule, Place 1 capsule (18 mcg total) into inhaler and inhale daily., Disp: 30 capsule, Rfl: 6  Past Medical History: Past Medical History  Diagnosis Date   Hypertension    Fibromyalgia    COPD (chronic obstructive pulmonary disease) (HCC)    Depression    GERD (gastroesophageal reflux disease)    Arthritis     Tobacco Use: History  Smoking status   Former Smoker -- 0.25 packs/day for 35 years   Types: Cigarettes   Quit date: 01/26/2015  Smokeless tobacco   Former Systems developer   Quit date: 11/22/1973    Labs: Recent Review Flowsheet Data    There is no flowsheet data  to display.       ADL [facility]:     ADL [facility]      05/09/15 0900       ADL [facility]   ADL Phase Entry     SOB Score total 78     Rest 1     Walk 5     Stairs 5     Bath 5     Dress 5     Shop 5         Pulmonary Function Assessment:     Pulmonary Function Assessment - 05/09/15 0900    Pulmonary Function Tests   RV% 220 %   DLCO% 54 %   Initial Spirometry Results   FVC% 50 %   FEV1% 29 %   FEV1/FVC Ratio 45   Post Bronchodilator Spirometry Results   FVC% 58 %   FEV1% 35 %   FEV1/FVC Ratio 45   Breath   Bilateral Breath Sounds Clear;Decreased   Shortness of Breath Yes;Fear of Shortness of Breath;Limiting activity      Exercise Target Goals:    Exercise Program Goal: Individual exercise prescription set with THRR, safety & activity barriers. Participant demonstrates ability to understand and report RPE using BORG scale, to self-measure pulse accurately, and to acknowledge the importance of the exercise prescription.  Exercise Prescription Goal: Starting with aerobic activity 30 plus minutes a day, 3 days per week for initial exercise  prescription. Provide home exercise prescription and guidelines that participant acknowledges understanding prior to discharge.  Activity Barriers & Risk Stratification:     Activity Barriers & Risk Stratification - 05/09/15 0900    Activity Barriers & Risk Stratification   Activity Barriers Joint Problems;Fibromyalgia;Deconditioning;Muscular Weakness;Shortness of Breath   Risk Stratification High      6 Minute Walk:     6 Minute Walk      05/09/15 1415       6 Minute Walk   Phase Initial     Distance 400 feet     Walk Time 4 minutes     Resting HR 91 bpm     Resting BP 146/76 mmHg     Max Ex. HR 118 bpm     Max Ex. BP 182/80 mmHg     RPE 18     Perceived Dyspnea  8     Symptoms Yes (comment)     Comments SOB, fatigue        Initial Exercise Prescription:     Initial Exercise Prescription - 05/09/15 1400     Date of Initial Exercise Prescription   Date 05/09/15   Treadmill   MPH 2   Grade 0   Minutes 10   Recumbant Bike   Level 2   RPM 40   Watts 20   Minutes 10   NuStep   Level 2   Watts 40   Minutes 10   Arm Ergometer   Level 1   Watts 10   Minutes 10   Recumbant Elliptical   Level 2   RPM 40   Watts 20   Minutes 10   REL-XR   Level 2   Watts 40   Minutes 10   Prescription Details   Frequency (times per week) 3   Duration Progress to 30 minutes of continuous aerobic without signs/symptoms of physical distress   Intensity   THRR REST +  30   Ratings of Perceived Exertion 11-15   Perceived Dyspnea 2-4   Progression Continue progressive overload as per policy without signs/symptoms or physical distress.   Resistance Training   Training Prescription Yes   Weight 2   Reps 10-15      Exercise Prescription Changes:   Discharge Exercise Prescription (Final Exercise Prescription Changes):    Nutrition:  Target Goals: Understanding of nutrition guidelines, daily intake of sodium 1500mg , cholesterol 200mg , calories 30% from fat and 7% or less from saturated fats, daily to have 5 or more servings of fruits and vegetables.  Biometrics:     Pre Biometrics - 05/09/15 1422    Pre Biometrics   Height 5\' 10"  (1.778 m)   Weight 277 lb (125.646 kg)   Waist Circumference 50.5 inches   Hip Circumference 50.5 inches   Waist to Hip Ratio 1 %   BMI (Calculated) 39.8       Nutrition Therapy Plan and Nutrition Goals:     Nutrition Therapy & Goals - 05/09/15 0900    Nutrition Therapy   Diet Plans to meet with dietitian; likes fried foods and snacks all day, especially since he stopped smoking; needs encouragement to drink more fluids - only 3 glasses of water/day, some coffee      Nutrition Discharge: Rate Your Plate Scores:   Psychosocial: Target Goals: Acknowledge presence or absence of depression, maximize coping skills, provide positive support system.  Participant is able to verbalize types and ability to use techniques and skills needed for reducing stress  and depression.  Initial Review & Psychosocial Screening:     Initial Psych Review & Screening - 05/09/15 0900    Initial Review   Current issues with History of Depression   Family Dynamics   Good Support System? Yes   Comments Hunter White has a history of depression and has counciled with therapists. He has good support from his daughter and mother and has his [place] faith. He has a history of past drug use and chronic pain.   Barriers   Psychosocial barriers to participate in program The patient should benefit from training in stress management and relaxation.   Screening Interventions   Interventions Encouraged to exercise;Program counselor consult      Quality of Life Scores:     Quality of Life - 05/09/15 0900    Quality of Life Scores   Health/Function Pre 5.56 %   Socioeconomic Pre 13.71 %   Psych/Spiritual Pre 6.29 %   Family Pre 22.5 %   GLOBAL Pre 9.38 %      PHQ-9:     Recent Review Flowsheet Data    Depression screen [facility] 2/9 05/09/2015   Decreased Interest 3   Down, Depressed, Hopeless 3   PHQ - 2 Score 6   Altered sleeping 3   Tired, decreased energy 3   Change in appetite 3   Feeling bad or failure about yourself  3   Trouble concentrating 1   Moving slowly or fidgety/restless 1   Suicidal thoughts 0   PHQ-9 Score 20   Difficult doing work/chores Very difficult      Psychosocial Evaluation and Intervention:   Psychosocial Re-Evaluation:  Education: Education Goals: Education classes will be provided on a weekly basis, covering required topics. Participant will state understanding/return demonstration of topics presented.  Learning Barriers/Preferences:     Learning Barriers/Preferences - 05/09/15 0900    Learning Barriers/Preferences   Learning Barriers None   Learning Preferences Group Instruction;Individual  Instruction;Pictoral;Skilled Demonstration;Verbal Instruction;Video;Written Material      Education Topics: Initial Evaluation Education: - Verbal, written and demonstration of respiratory meds, RPE/PD scales, oximetry and breathing techniques. Instruction on use of nebulizers and MDIs: cleaning and proper use, rinsing mouth with steroid doses and importance of monitoring MDI activations.          Pulmonary Rehab from 05/09/2015 in [place]   Date  05/09/15   Educator  LB   Instruction Review Code  2- meets goals/outcomes      General Nutrition Guidelines/Fats and Fiber: -Group instruction provided by verbal, written material, models and posters to present the general guidelines for heart healthy nutrition. Gives an explanation and review of dietary fats and fiber.   Controlling Sodium/Reading Food Labels: -Group verbal and written material supporting the discussion of sodium use in heart healthy nutrition. Review and explanation with models, verbal and written materials for utilization of the food label.   Exercise Physiology & Risk Factors: - Group verbal and written instruction with models to review the exercise physiology of the cardiovascular system and associated critical values. Details cardiovascular disease risk factors and the goals associated with each risk factor.   Aerobic Exercise & Resistance Training: - Gives group verbal and written discussion on the health impact of inactivity. On the components of aerobic and resistive training programs and the benefits of this training and how to safely progress through these programs.   Flexibility, Balance, General Exercise Guidelines: - Provides group verbal and written instruction on the benefits  of flexibility and balance training programs. Provides general exercise guidelines with specific guidelines to those with heart or lung disease. Demonstration and skill practice  provided.   Stress Management: - Provides group verbal and written instruction about the health risks of elevated stress, cause of high stress, and healthy ways to reduce stress.   Depression: - Provides group verbal and written instruction on the correlation between heart/lung disease and depressed mood, treatment options, and the stigmas associated with seeking treatment.   Exercise & Equipment Safety: - Individual verbal instruction and demonstration of equipment use and safety with use of the equipment.   Infection Prevention: - Provides verbal and written material to individual with discussion of infection control including proper hand washing and proper equipment cleaning during exercise session.   Falls Prevention: - Provides verbal and written material to individual with discussion of falls prevention and safety.      Pulmonary Rehab from 05/09/2015 in [place]   Date  05/09/15   Educator  LB   Instruction Review Code  2- meets goals/outcomes      Diabetes: - Individual verbal and written instruction to review signs/symptoms of diabetes, desired ranges of glucose level fasting, after meals and with exercise. Advice that pre and post exercise glucose checks will be done for 3 sessions at entry of program.   Chronic Lung Diseases: - Group verbal and written instruction to review new updates, new respiratory medications, new advancements in procedures and treatments. Provide informative websites and "800" numbers of self-education.   Lung Procedures: - Group verbal and written instruction to describe testing methods done to diagnose lung disease. Review the outcome of test results. Describe the treatment choices: Pulmonary Function Tests, ABGs and oximetry.   Energy Conservation: - Provide group verbal and written instruction for methods to conserve energy, plan and organize activities. Instruct on pacing techniques, use of adaptive  equipment and posture/positioning to relieve shortness of breath.   Triggers: - Group verbal and written instruction to review types of environmental controls: home humidity, furnaces, filters, dust mite/pet prevention, HEPA vacuums. To discuss weather changes, air quality and the benefits of nasal washing.   Exacerbations: - Group verbal and written instruction to provide: warning signs, infection symptoms, calling MD promptly, preventive modes, and value of vaccinations. Review: effective airway clearance, coughing and/or vibration techniques. Create an Sports administrator.   Oxygen: - Individual and group verbal and written instruction on oxygen therapy. Includes supplement oxygen, available portable oxygen systems, continuous and intermittent flow rates, oxygen safety, concentrators, and Medicare reimbursement for oxygen.      Pulmonary Rehab from 05/09/2015 in [place]   Date  05/09/15   Educator  LB   Instruction Review Code  2- meets goals/outcomes      Respiratory Medications: - Group verbal and written instruction to review medications for lung disease. Drug class, frequency, complications, importance of spacers, rinsing mouth after steroid MDI's, and proper cleaning methods for nebulizers.      Pulmonary Rehab from 05/09/2015 in [place]   Date  05/09/15   Educator  LB   Instruction Review Code  2- meets goals/outcomes      AED/CPR: - Group verbal and written instruction with the use of models to demonstrate the basic use of the AED with the basic ABC's of resuscitation.   Breathing Retraining: - Provides individuals verbal and written instruction on purpose, frequency, and proper technique of diaphragmatic breathing and  pursed-lipped breathing. Applies individual practice skills.      Pulmonary Rehab from 05/09/2015 in [place]   Date  05/09/15   Educator  LB    Instruction Review Code  2- meets goals/outcomes      Anatomy and Physiology of the Lungs: - Group verbal and written instruction with the use of models to provide basic lung anatomy and physiology related to function, structure and complications of lung disease.   Heart Failure: - Group verbal and written instruction on the basics of heart failure: signs/symptoms, treatments, explanation of ejection fraction, enlarged heart and cardiomyopathy.   Sleep Apnea: - Individual verbal and written instruction to review Obstructive Sleep Apnea. Review of risk factors, methods for diagnosing and types of masks and machines for OSA.      Pulmonary Rehab from 05/09/2015 in [place]   Date  05/09/15   Educator  LB   Instruction Review Code  2- meets goals/outcomes      Anxiety: - Provides group, verbal and written instruction on the correlation between heart/lung disease and anxiety, treatment options, and management of anxiety.   Relaxation: - Provides group, verbal and written instruction about the benefits of relaxation for patients with heart/lung disease. Also provides patients with examples of relaxation techniques.   Knowledge Questionnaire Score:     Knowledge Questionnaire Score - 05/09/15 0900    Knowledge Questionnaire Score   Pre Score -3      Personal Goals and Risk Factors at Admission:     Personal Goals and Risk Factors at Admission - 05/09/15 0900    Personal Goals and Risk Factors on Admission    Weight Management Yes   Intervention Learn and follow the exercise and diet guidelines while in the program. Utilize the nutrition and education classes to help gain knowledge of the diet and exercise expectations in the program  Plans to meet with dietitian; likes fried foods and snacks all day, especially since he stopped smoking; needs encouragement to drink more fluids - only 3 glasses of water/day, some coffee   Admit Weight 277  lb (125.646 kg)   Goal Weight 200 lb (90.719 kg)   Increase Aerobic Exercise and Physical Activity Yes   Intervention While in program, learn and follow the exercise prescription taught. Start at a low level workload and increase workload after able to maintain previous level for 30 minutes. Increase time before increasing intensity.  Hunter White would like to be more active, especially daily activites. He has a [employer] and in the past, he exercised in the pool - walking and swimming, because less pain.   Understand more about Heart/Pulmonary Disease. Yes   Intervention --  Hunter White has COPD and CHF and would like tolearn more about these diseases and management.    Improve shortness of breath with ADL's Yes   Intervention While in program, learn and follow the exercise prescription taught. Start at a low level workload and increase workload ad advised by the exercise physiologist. Increase time before increasing intensity.  Hunter White has severe shortness of breath with activity . His SOB questionnaire was scored high at 78.   Develop more efficient breathing techniques such as purse lipped breathing and diaphragmatic breathing; and practicing self-pacing with activity Yes   Intervention While in program, learn and utilize the specific breathing techniques taught to you. Continue to practice and use the techniques as needed.  Hunter White has been using PLB, but  did not know it was a named technique. I encouraged him to use PLB with activity in class and at home.   Increase knowledge of respiratory medications and ability to use respiratory devices properly.  Yes   Intervention While in program learn and demonstrate appropriate use of your oxygen therapy by increasing flow with exertion, manage oxygen tank operation, including continuous and intermittent flow.  Understanding oxygen is a drug ordered by your physician.;While in program, learn to administer MDI, nebulizer, and spacer  properly.;Learn to take respiratory medicine as ordered.;While in program, learn to Clean MDI, nebulizers, and spacers properly.  Hunter White takes Advair, Spiriva, and Proair. I educated him on a spacer, but will give him one when ordered. He wears 2l/m Oxygen and uses a small gas portable tank from [employer]. He has CPAP, but cannot tolerate a full mask  - plans to change  mask.   Hypertension Yes   Goal Participant will see blood pressure controlled within the values of 140/74mm/Hg or within value directed by their physician.   Intervention Provide nutrition & aerobic exercise along with prescribed medications to achieve BP 140/90 or less.      Personal Goals and Risk Factors Review:    Personal Goals Discharge (Final Personal Goals and Risk Factors Review):    Comments: Hunter White plans to start [facility] on 05/12/2015 and attend 3 days/week.

## 2015-05-15 ENCOUNTER — Encounter: Payer: Medicaid Other | Admitting: *Deleted

## 2015-05-15 DIAGNOSIS — J449 Chronic obstructive pulmonary disease, unspecified: Secondary | ICD-10-CM | POA: Diagnosis not present

## 2015-05-15 NOTE — Progress Notes (Signed)
Daily Session Note  Patient Details  Name: RUE TINNEL MRN: [id number] Date of Birth: 1956-06-01 Referring Provider:  Vilinda Boehringer, MD  Encounter Date: 05/15/2015  Check In:     Session Check In - 05/15/15 1039    Check-In   Staff Present Laureen Owens Shark BS, RRT, Respiratory Therapist;Adithya Difrancesco Alfonso Patten, ACSM CEP Exercise Physiologist;Steven Way BS, ACSM EP-C, Exercise Physiologist   ER physicians immediately available to respond to emergencies [facility] immediately available ER MD   Physician(s) Reita Cliche and Corky Downs   Medication changes reported     No   Fall or balance concerns reported    No   Warm-up and Cool-down Performed on first and last piece of equipment   VAD Patient? No   Pain Assessment   Currently in Pain? Other (Comment)  no change in chronic pain   Multiple Pain Sites No         Goals Met:  Proper associated with RPD/PD & O2 Sat Independence with exercise equipment Exercise tolerated well Strength training completed today  Goals Unmet:  Not Applicable  Goals Comments: The patient's personal and exercise goals are anticipated to be met in 34 sessions.  Patient completed exercise prescription and all exercise goals during rehab session. The exercise was tolerated well and the patient is progressing in the program.       Mr Vancuren did have increased pain with exercise and could not do the XR, but he met his goals on the NS, BioStep, and AE.    Dr. Emily Filbert is Medical Director for [facility] and [facility].

## 2015-05-16 ENCOUNTER — Encounter: Payer: Self-pay | Admitting: Internal Medicine

## 2015-05-16 ENCOUNTER — Ambulatory Visit (INDEPENDENT_AMBULATORY_CARE_PROVIDER_SITE_OTHER): Payer: Medicaid Other | Admitting: Internal Medicine

## 2015-05-16 VITALS — HR 94 | Ht 70.0 in | Wt 285.0 lb

## 2015-05-16 DIAGNOSIS — G4733 Obstructive sleep apnea (adult) (pediatric): Secondary | ICD-10-CM

## 2015-05-16 DIAGNOSIS — R600 Localized edema: Secondary | ICD-10-CM | POA: Diagnosis not present

## 2015-05-16 DIAGNOSIS — R0902 Hypoxemia: Secondary | ICD-10-CM

## 2015-05-16 DIAGNOSIS — J449 Chronic obstructive pulmonary disease, unspecified: Secondary | ICD-10-CM | POA: Diagnosis not present

## 2015-05-16 DIAGNOSIS — R0602 Shortness of breath: Secondary | ICD-10-CM

## 2015-05-16 NOTE — Assessment & Plan Note (Signed)
PSG: 01/17/2015 Titration Study: 02/17/15 -Severe obstructive sleep apnea, RDI 44.4 - CPAP 15cm H20 with 2L O2 at night and with naps.  - face mask with leak, may use nasal pillows if approved by DME  [facility]A  Discussed sleep data and reviewed with patient.  Encouraged proper weight management.  Excessive weight may contribute to snoring.  Monitor sedative use.  Discussed driving precautions and its relationship with hypersomnolence.  Discussed operating dangerous equipment and its relationship with hypersomnolence.  Discussed sleep hygiene, and benefits of a fixed sleep waked time.  The importance of getting eight or more hours of sleep discussed with patient.  Discussed limiting the use of the computer and television before bedtime.  Decrease naps during the day, so night time sleep will become enhanced.  Limit caffeine, and sleep deprivation.  HTN, stroke, and heart failure are potential risk factors.     Plan: CPAP 15cmH20 - face mask with leak, may use nasal pillows if approved by DME

## 2015-05-16 NOTE — Assessment & Plan Note (Signed)
Abnormal 57mwt at last visit.  Currently on 2L O2 continuously

## 2015-05-16 NOTE — Assessment & Plan Note (Signed)
DDx - obesity, HTN, cardiac related  ECHO 04/2015 - mild dilated LA, mild diastolic dysfunction  Plan - cont with diuresis as dictated by PMD - cardiology referral given his co-morbidities. Concerns for pulmonary HTN (maily group 2 and 3).

## 2015-05-16 NOTE — Patient Instructions (Addendum)
Follow up with Dr. Stevenson Clinch in: 3 months -  Continue with Advair, Spiriva, albuterol -  We will refer you to cardiology for further workup of continued shortness of breath with lower leg swelling. -  Continue to avoid any form of tobacco - monitor your weight - please start an exercise program (water exercise is possible)

## 2015-05-16 NOTE — Assessment & Plan Note (Signed)
Pulmonary function testing with severe, very severe, obstruction, FEV1 29%, postBD FEV1 35% Patient has stopped smoking and has gained significant weight since then, counseled today on diet and exercise.   Patient will require Advair, Spiriva, rescue inhaler Medicaid has finally approved his pulmonary rehab and he is attending 3 times per week.  Has joined the [facility], and attending about 5/7 days, doing water exercises.   MMRC=2 Patient with other comorbidities such as obesity, hypertension, tobacco abuse. Controlling all his comorbidities will assist in his overall quality of life.  Plan: -Advair 500/50 -Spiriva Respimat -Albuterol rescue inhaler -Continue to avoid tobacco -Diet, exercise, weight loss.

## 2015-05-16 NOTE — Progress Notes (Signed)
[facility] Consultation      MRN# [id number] Hunter White 14-Dec-1955   CC: Chief Complaint  Patient presents with  . Follow-up    has trouble getting CPAP mask to seal; only shows correct if pt holds in place. 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  Former smoker , 40 pack year, quit in 01/2015. On Advair/spiriva, cpap, 2L O2 at night.   ROV 02/08/15 Patient presents for follow up visit. Quit smoking 2 weeks ago. Heat makes dyspnea worst and activity. Activity is difficult also due to hip and chronic back pain issues.  Currently on 2L of O2 at night, this causes some sinus drainage (clear).   Plan: -Advair 500/50 -Spiriva Respimat -Albuterol rescue inhaler -Referral to pulmonary rehabilitation -Continue to avoid tobacco -Diet, exercise, weight loss.   ROV 03/22/15: Presents today for a follow-up visit, since his last visit he's had a CPAP titration study. His titration study showed that he requires 15 cm H2O. States that his breathing has improved since he stopped smoking. States that he is on 2 fluid pills but still having lower ext. Swelling.  Overall with good improvement in his breathing status. Medicaid denied his pulm rehab. Currently attending the [facility], doing water exercises, 5/7 days.  Has had some weight gain.    Events since last clinic visit: Patient presents today for a follow-up visit. Since his last visit he's had one acute care visit with [name],  In early October, presented with worsening shortness of breath and sputum production, and diagnosed with COPD exacerbation, was started on antibiotics and steroids.  patient states today that overall his breathing is doing fairly well, he is on 3 L of oxygen continuously, he also has a facemask eval with [facility] for CPAP use. Today his major concerns of bilateral swelling of his  lower legs and tingling in the bottom of his feet, he still has a chronic shortness of breath, and mild intermittent cough which is mildly productive at times.  He will see his DME tomorrow due to his facemask for his CPAP machine not fitting properly.  Medication:   Current Outpatient Rx  Name  Route  Sig  Dispense  Refill  . acetaminophen (TYLENOL) 650 MG CR tablet   Oral   Take 1,300 mg by mouth every 8 (eight) hours as needed for pain.         Marland Kitchen albuterol (PROVENTIL HFA;VENTOLIN HFA) 108 (90 BASE) MCG/ACT inhaler   Inhalation   Inhale 1-2 puffs into the lungs every 4 (four) hours as needed for wheezing or shortness of breath.   1 Inhaler   6   . esomeprazole (NEXIUM) 40 MG capsule   Oral   Take 40 mg by mouth 2 (two) times daily before a meal.         . Fluticasone-Salmeterol (ADVAIR) 500-50 MCG/DOSE AEPB   Inhalation   Inhale 1 puff into the lungs 2 (two) times daily.   60 each   4   . hydrochlorothiazide (HYDRODIURIL) 25 MG tablet   Oral   Take 25 mg by mouth 2 (two) times daily.          Marland Kitchen lisinopril (PRINIVIL,ZESTRIL) 40 MG tablet   Oral   Take 40 mg by mouth daily.         . pregabalin (LYRICA) 50 MG capsule  Oral   Take 150 mg by mouth 2 (two) times daily.          Marland Kitchen tiotropium (SPIRIVA) 18 MCG inhalation capsule   Inhalation   Place 1 capsule (18 mcg total) into inhaler and inhale daily.   30 capsule   6      Review of Systems  Constitutional: Negative for fever and chills.  Eyes: Negative for blurred vision and double vision.  Respiratory: Positive for shortness of breath. Negative for cough, hemoptysis, sputum production and wheezing.        Chronic sob, but improving since stopped smoking  Cardiovascular: Positive for leg swelling and PND. Negative for chest pain and palpitations.  Gastrointestinal: Negative for heartburn, nausea and vomiting.  Skin: Negative for rash.  Neurological: Negative for headaches.  Endo/Heme/Allergies: Negative  for environmental allergies. Does not bruise/bleed easily.      Allergies:  Review of patient's allergies indicates no known allergies.  Physical Examination:  VS: Pulse 94  Ht 5\' 10"  (1.778 m)  Wt 285 lb (129.275 kg)  BMI 40.89 kg/m2  SpO2 95%  General Appearance: No distress  HEENT: PERRLA, no ptosis, no other lesions noticed Pulmonary:good respiratory effort, dec bilateral BS sound at the bases  Cardiovascular:  Normal S1,S2.  No m/r/g.     Abdomen:Exam: Benign, Soft, non-tender, No masses  Skin:   warm, no rashes, no ecchymosis  Extremities: normal, no cyanosis, clubbing, warm with normal capillary refill.  2+ pitting edema Bilateral LE    Assessment and Plan: COPD (chronic obstructive pulmonary disease) Pulmonary function testing with severe, very severe, obstruction, FEV1 29%, postBD FEV1 35% Patient has stopped smoking and has gained significant weight since then, counseled today on diet and exercise.   Patient will require Advair, Spiriva, rescue inhaler Medicaid has finally approved his pulmonary rehab and he is attending 3 times per week.  Has joined the [facility], and attending about 5/7 days, doing water exercises.   MMRC=2 Patient with other comorbidities such as obesity, hypertension, tobacco abuse. Controlling all his comorbidities will assist in his overall quality of life.  Plan: -Advair 500/50 -Spiriva Respimat -Albuterol rescue inhaler -Continue to avoid tobacco -Diet, exercise, weight loss.       Hypoxia Abnormal 63mwt at last visit.  Currently on 2L O2 continuously      OSA (obstructive sleep apnea) PSG: 01/17/2015 Titration Study: 02/17/15 -Severe obstructive sleep apnea, RDI 44.4 - CPAP 15cm H20 with 2L O2 at night and with naps.  - face mask with leak, may use nasal pillows if approved by DME  OSA  Discussed sleep data and reviewed with patient.  Encouraged proper weight management.  Excessive weight may contribute to snoring.    Monitor sedative use.  Discussed driving precautions and its relationship with hypersomnolence.  Discussed operating dangerous equipment and its relationship with hypersomnolence.  Discussed sleep hygiene, and benefits of a fixed sleep waked time.  The importance of getting eight or more hours of sleep discussed with patient.  Discussed limiting the use of the computer and television before bedtime.  Decrease naps during the day, so night time sleep will become enhanced.  Limit caffeine, and sleep deprivation.  HTN, stroke, and heart failure are potential risk factors.     Plan: CPAP 15cmH20 - face mask with leak, may use nasal pillows if approved by DME       Edema leg DDx - obesity, HTN, cardiac related  ECHO 04/2015 - mild dilated LA, mild diastolic dysfunction  Plan -  cont with diuresis as dictated by PMD - cardiology referral given his co-morbidities. Concerns for pulmonary HTN (maily group 2 and 3).        Updated Medication List Outpatient Encounter Prescriptions as of 05/16/2015  Medication Sig  . acetaminophen (TYLENOL) 650 MG CR tablet Take 1,300 mg by mouth every 8 (eight) hours as needed for pain.  Marland Kitchen albuterol (PROVENTIL HFA;VENTOLIN HFA) 108 (90 BASE) MCG/ACT inhaler Inhale 1-2 puffs into the lungs every 4 (four) hours as needed for wheezing or shortness of breath.  . esomeprazole (NEXIUM) 40 MG capsule Take 40 mg by mouth 2 (two) times daily before a meal.  . Fluticasone-Salmeterol (ADVAIR) 500-50 MCG/DOSE AEPB Inhale 1 puff into the lungs 2 (two) times daily.  . hydrochlorothiazide (HYDRODIURIL) 25 MG tablet Take 25 mg by mouth 2 (two) times daily.   Marland Kitchen lisinopril (PRINIVIL,ZESTRIL) 40 MG tablet Take 40 mg by mouth daily.  . pregabalin (LYRICA) 50 MG capsule Take 150 mg by mouth 2 (two) times daily.   Marland Kitchen tiotropium (SPIRIVA) 18 MCG inhalation capsule Place 1 capsule (18 mcg total) into inhaler and inhale daily.  . [DISCONTINUED] doxycycline (VIBRA-TABS) 100  MG tablet Take 1 tablet (100 mg total) by mouth 2 (two) times daily.   No facility-administered encounter medications on file as of 05/16/2015.    Orders for this visit: Orders Placed This Encounter  Procedures  . Ambulatory referral to Cardiology    Referral Priority:  Routine    Referral Type:  Consultation    Referral Reason:  Specialty Services Required    Referred to Provider:  Minna Merritts, MD    Requested Specialty:  Cardiology    Number of Visits Requested:  1    Thank  you for the visitation and for allowing  [facility] to assist in the care of your patient. Our recommendations are noted above.  Please contact us if we can be of further service.  Vilinda Boehringer, MD [facility] Office Number: [phone]

## 2015-05-17 ENCOUNTER — Encounter: Payer: Medicaid Other | Admitting: *Deleted

## 2015-05-17 DIAGNOSIS — J449 Chronic obstructive pulmonary disease, unspecified: Secondary | ICD-10-CM | POA: Diagnosis not present

## 2015-05-17 NOTE — Progress Notes (Signed)
Daily Session Note  Patient Details  Name: Hunter White MRN: [id number] Date of Birth: 1955-10-08 Referring Provider:  Vilinda Boehringer, MD  Encounter Date: 05/17/2015  Check In:     Session Check In - 05/17/15 1024    Check-In   Staff Present Candiss Norse MS, ACSM CEP Exercise Physiologist;Laureen Janell Quiet, RRT, Respiratory Therapist;Steven Way BS, ACSM EP-C, Exercise Physiologist   ER physicians immediately available to respond to emergencies [facility] immediately available ER MD   Physician(s) Jimmye Norman and Joni Fears   Medication changes reported     No   Fall or balance concerns reported    No   Warm-up and Cool-down Performed on first and last piece of equipment   VAD Patient? No   Pain Assessment   Currently in Pain? No/denies   Multiple Pain Sites No           Exercise Prescription Changes - 05/17/15 1000    Response to Exercise   Symptoms None   Comments Reviewed gym orientation and the equipment functions and settings. Procedures and policies of the gym were outlined and explained. The patient's individual exercise prescription and treatment plan were reviewed with them. All starting workloads were established based on the results of the functional testing  done at the initial intake visit. The plan for exercise progression was also introduced and progression will be customized based on the patient's performance and goals.    Duration Progress to 30 minutes of continuous aerobic without signs/symptoms of physical distress   Intensity Rest + 30   Progression Continue progressive overload as per policy without signs/symptoms or physical distress.   Resistance Training   Training Prescription Yes   Weight 2   Reps 10-12   Interval Training   Interval Training No   Recumbant Bike   Level 1  BioStep   Watts 20   Minutes 8   NuStep   Level 1   Watts 60   Minutes 10   Arm Ergometer   Level 1   Watts 15   Minutes 10      Goals Met:  Proper associated  with RPD/PD & O2 Sat Independence with exercise equipment Using PLB without cueing & demonstrates good technique Exercise tolerated well Personal goals reviewed Strength training completed today  Goals Unmet:  Not Applicable  Goals Comments: Patient completed exercise prescription and all exercise goals during rehab session. The exercise was tolerated well and the patient is progressing in the program.  Personal and exercise goals expected to be met in 33 more sessions. Progress on specific individualized goals will be charted in patient's ITP. Upon completion of the program the patient will be comfortable managing exercise goals and progression on their own.    Dr. Emily Filbert is Medical Director for [facility] and [facility].

## 2015-05-29 DIAGNOSIS — J449 Chronic obstructive pulmonary disease, unspecified: Secondary | ICD-10-CM | POA: Diagnosis not present

## 2015-05-29 NOTE — Progress Notes (Signed)
Daily Session Note  Patient Details  Name: Hunter White MRN: [id number] Date of Birth: 09/13/1955 Referring Provider:  Mungal, Vishal, MD  Encounter Date: 05/29/2015  Check In:     Session Check In - 05/29/15 1030    Check-In   Staff Present Laureen Brown, BS, RRT, Respiratory Therapist;Steven Way, BS, ACSM EP-C, Exercise Physiologist   ER physicians immediately available to respond to emergencies [facility] immediately available ER MD   Physician(s) williams and schaevit   Medication changes reported     No   Fall or balance concerns reported    No   Warm-up and Cool-down Performed on first and last piece of equipment   VAD Patient? No   Pain Assessment   Currently in Pain? No/denies         Goals Met:  Proper associated with RPD/PD & O2 Sat Exercise tolerated well No report of cardiac concerns or symptoms Strength training completed today  Goals Unmet:  Not Applicable  Goals Comments:   Dr. Mark Miller is Medical Director for [facility] and [facility]. 

## 2015-05-31 ENCOUNTER — Encounter: Payer: Self-pay | Admitting: Respiratory Therapy

## 2015-05-31 ENCOUNTER — Encounter: Payer: Medicaid Other | Admitting: *Deleted

## 2015-05-31 DIAGNOSIS — J449 Chronic obstructive pulmonary disease, unspecified: Secondary | ICD-10-CM

## 2015-05-31 NOTE — Progress Notes (Signed)
Daily Session Note  Patient Details  Name: Hunter White MRN: [id number] Date of Birth: [birth year] Referring Provider:  Vilinda Boehringer, MD  Encounter Date: 05/31/2015  Check In:     Session Check In - 05/31/15 1036    Check-In   Staff Present Candiss Norse, MS, ACSM CEP, Exercise Physiologist;Steven Way, BS, ACSM EP-C, Exercise Physiologist;Laureen Owens Shark, BS, RRT, Respiratory Therapist   ER physicians immediately available to respond to emergencies [facility] immediately available ER MD   Physician(s) Jimmye Norman and Clearnce Hasten   Medication changes reported     No   Fall or balance concerns reported    No   Warm-up and Cool-down Performed on first and last piece of equipment   VAD Patient? No   Pain Assessment   Currently in Pain? No/denies   Multiple Pain Sites No         Goals Met:  Personal goals reviewed  Goals Unmet:  BP  Goals Comments: See ITP comments. [name]'s BP was high and he had a lot of fluid accumulation. He was sent home and scheduled a visit to his MD.    Dr. Emily Filbert is Medical Director for [facility] and [facility].

## 2015-06-02 ENCOUNTER — Encounter: Payer: Medicaid Other | Attending: Internal Medicine

## 2015-06-02 DIAGNOSIS — J449 Chronic obstructive pulmonary disease, unspecified: Secondary | ICD-10-CM | POA: Insufficient documentation

## 2015-06-05 ENCOUNTER — Encounter: Payer: Self-pay | Admitting: *Deleted

## 2015-06-05 ENCOUNTER — Telehealth: Payer: Self-pay | Admitting: *Deleted

## 2015-06-05 DIAGNOSIS — J449 Chronic obstructive pulmonary disease, unspecified: Secondary | ICD-10-CM

## 2015-06-05 NOTE — Progress Notes (Signed)
Pulmonary Individual Treatment Plan  Patient Details  Name: Hunter White MRN: [id number] Date of Birth: 01/09/1956 Referring Provider:  Vilinda Boehringer  Initial Encounter Date:  05/09/2015  Visit Diagnosis: COPD, severe ([place])  Patient's Home Medications on Admission:  Current outpatient prescriptions:  .  acetaminophen (TYLENOL) 650 MG CR tablet, Take 1,300 mg by mouth every 8 (eight) hours as needed for pain., Disp: , Rfl:  .  albuterol (PROVENTIL HFA;VENTOLIN HFA) 108 (90 BASE) MCG/ACT inhaler, Inhale 1-2 puffs into the lungs every 4 (four) hours as needed for wheezing or shortness of breath., Disp: 1 Inhaler, Rfl: 6 .  esomeprazole (NEXIUM) 40 MG capsule, Take 40 mg by mouth 2 (two) times daily before a meal., Disp: , Rfl:  .  Fluticasone-Salmeterol (ADVAIR) 500-50 MCG/DOSE AEPB, Inhale 1 puff into the lungs 2 (two) times daily., Disp: 60 each, Rfl: 4 .  hydrochlorothiazide (HYDRODIURIL) 25 MG tablet, Take 25 mg by mouth 2 (two) times daily. , Disp: , Rfl:  .  lisinopril (PRINIVIL,ZESTRIL) 40 MG tablet, Take 40 mg by mouth daily., Disp: , Rfl:  .  pregabalin (LYRICA) 50 MG capsule, Take 150 mg by mouth 2 (two) times daily. , Disp: , Rfl:  .  tiotropium (SPIRIVA) 18 MCG inhalation capsule, Place 1 capsule (18 mcg total) into inhaler and inhale daily., Disp: 30 capsule, Rfl: 6  Past Medical History: Past Medical History  Diagnosis Date  . Hypertension   . Fibromyalgia   . COPD (chronic obstructive pulmonary disease) ([place])   . Depression   . GERD (gastroesophageal reflux disease)   . Arthritis     Tobacco Use: History  Smoking status  . Former Smoker -- 0.25 packs/day for 35 years  . Types: Cigarettes  . Quit date: 01/26/2015  Smokeless tobacco  . Former Systems developer  . Quit date: 11/22/1973    Labs: Recent Review Flowsheet Data    There is no flowsheet data to display.       ADL UCSD:     ADL UCSD      05/09/15 0900       ADL UCSD   ADL Phase Entry     SOB Score  total 78     Rest 1     Walk 5     Stairs 5     Bath 5     Dress 5     Shop 5         Pulmonary Function Assessment:     Pulmonary Function Assessment - 05/09/15 0900    Pulmonary Function Tests   RV% 220 %   DLCO% 54 %   Initial Spirometry Results   FVC% 50 %   FEV1% 29 %   FEV1/FVC Ratio 45   Post Bronchodilator Spirometry Results   FVC% 58 %   FEV1% 35 %   FEV1/FVC Ratio 45   Breath   Bilateral Breath Sounds Clear;Decreased   Shortness of Breath Yes;Fear of Shortness of Breath;Limiting activity      Exercise Target Goals:    Exercise Program Goal: Individual exercise prescription set with THRR, safety & activity barriers. Participant demonstrates ability to understand and report RPE using BORG scale, to self-measure pulse accurately, and to acknowledge the importance of the exercise prescription.  Exercise Prescription Goal: Starting with aerobic activity 30 plus minutes a day, 3 days per week for initial exercise prescription. Provide home exercise prescription and guidelines that participant acknowledges understanding prior to discharge.  Activity Barriers & Risk Stratification:  Activity Barriers & Risk Stratification - 05/09/15 0900    Activity Barriers & Risk Stratification   Activity Barriers Joint Problems;Fibromyalgia;Deconditioning;Muscular Weakness;Shortness of Breath   Risk Stratification High      6 Minute Walk:     6 Minute Walk      05/09/15 1415       6 Minute Walk   Phase Initial     Distance 400 feet     Walk Time 4 minutes     Resting HR 91 bpm     Resting BP 146/76 mmHg     Max Ex. HR 118 bpm     Max Ex. BP 182/80 mmHg     RPE 18     Perceived Dyspnea  8     Symptoms Yes (comment)     Comments SOB, fatigue        Initial Exercise Prescription:     Initial Exercise Prescription - 05/09/15 1400    Date of Initial Exercise Prescription   Date 05/09/15   Treadmill   MPH 2   Grade 0   Minutes 10   Recumbant Bike    Level 2   RPM 40   Watts 20   Minutes 10   NuStep   Level 2   Watts 40   Minutes 10   Arm Ergometer   Level 1   Watts 10   Minutes 10   Recumbant Elliptical   Level 2   RPM 40   Watts 20   Minutes 10   REL-XR   Level 2   Watts 40   Minutes 10   Prescription Details   Frequency (times per week) 3   Duration Progress to 30 minutes of continuous aerobic without signs/symptoms of physical distress   Intensity   THRR REST +  30   Ratings of Perceived Exertion 11-15   Perceived Dyspnea 2-4   Progression Continue progressive overload as per policy without signs/symptoms or physical distress.   Resistance Training   Training Prescription Yes   Weight 2   Reps 10-15      Exercise Prescription Changes:     Exercise Prescription Changes      05/15/15 1500 05/17/15 1000 05/29/15 1639       Exercise Review   Progression   Yes     Response to Exercise   Blood Pressure (Admit) 150/80 mmHg  140/80 mmHg     Blood Pressure (Exercise) 180/88 mmHg  142/84 mmHg     Blood Pressure (Exit) 130/86 mmHg  140/82 mmHg     Heart Rate (Admit) 79 bpm  90 bpm     Heart Rate (Exercise) 92 bpm  100 bpm     Heart Rate (Exit) 85 bpm  84 bpm     Oxygen Saturation (Admit) 93 %  2l/m intermittent  91 %     Oxygen Saturation (Exercise) 93 %  2l/m Continuous  92 %     Oxygen Saturation (Exit) 95 %  95 %     Rating of Perceived Exertion (Exercise) 15  12     Perceived Dyspnea (Exercise) 4  3     Symptoms  None None     Comments  Reviewed gym orientation and the equipment functions and settings. Procedures and policies of the gym were outlined and explained. The patient's individual exercise prescription and treatment plan were reviewed with them. All starting workloads were established based on the results of the functional testing  done at the initial intake  visit. The plan for exercise progression was also introduced and progression will be customized based on the patient's performance and goals.        Duration  Progress to 30 minutes of continuous aerobic without signs/symptoms of physical distress Progress to 30 minutes of continuous aerobic without signs/symptoms of physical distress     Intensity  Rest + 30 Rest + 30     Progression  Continue progressive overload as per policy without signs/symptoms or physical distress. Continue progressive overload as per policy without signs/symptoms or physical distress.     Resistance Training   Training Prescription Yes Yes Yes     Weight '2 2 2     ' Reps 10-12 10-12 10-12     Interval Training   Interval Training No No No     Recumbant Bike   Level 1  BioStep 1  BioStep 1  BioStep     Watts '20 20 20     ' Minutes '8 8 8     ' NuStep   Level '1 1 1     ' Watts 60 60 60     Minutes '10 10 10     ' Arm Ergometer   Level '1 1 1     ' Watts '15 15 15     ' Minutes '10 10 10        ' Discharge Exercise Prescription (Final Exercise Prescription Changes):     Exercise Prescription Changes - 05/29/15 1639    Exercise Review   Progression Yes   Response to Exercise   Blood Pressure (Admit) 140/80 mmHg   Blood Pressure (Exercise) 142/84 mmHg   Blood Pressure (Exit) 140/82 mmHg   Heart Rate (Admit) 90 bpm   Heart Rate (Exercise) 100 bpm   Heart Rate (Exit) 84 bpm   Oxygen Saturation (Admit) 91 %   Oxygen Saturation (Exercise) 92 %   Oxygen Saturation (Exit) 95 %   Rating of Perceived Exertion (Exercise) 12   Perceived Dyspnea (Exercise) 3   Symptoms None   Duration Progress to 30 minutes of continuous aerobic without signs/symptoms of physical distress   Intensity Rest + 30   Progression Continue progressive overload as per policy without signs/symptoms or physical distress.   Resistance Training   Training Prescription Yes   Weight 2   Reps 10-12   Interval Training   Interval Training No   Recumbant Bike   Level 1  BioStep   Watts 20   Minutes 8   NuStep   Level 1   Watts 60   Minutes 10   Arm Ergometer   Level 1   Watts 15    Minutes 10       Nutrition:  Target Goals: Understanding of nutrition guidelines, daily intake of sodium <1538m, cholesterol <203m calories 30% from fat and 7% or less from saturated fats, daily to have 5 or more servings of fruits and vegetables.  Biometrics:     Pre Biometrics - 05/09/15 1422    Pre Biometrics   Height '5\' 10"'  (1.778 m)   Weight 277 lb (125.646 kg)   Waist Circumference 50.5 inches   Hip Circumference 50.5 inches   Waist to Hip Ratio 1 %   BMI (Calculated) 39.8       Nutrition Therapy Plan and Nutrition Goals:     Nutrition Therapy & Goals - 05/09/15 0900    Nutrition Therapy   Diet Plans to meet with dietitian; likes fried foods and snacks all day, especially since he stopped  smoking; needs encouragement to drink more fluids - only 3 glasses of water/day, some coffee      Nutrition Discharge: Rate Your Plate Scores:   Psychosocial: Target Goals: Acknowledge presence or absence of depression, maximize coping skills, provide positive support system. Participant is able to verbalize types and ability to use techniques and skills needed for reducing stress and depression.  Initial Review & Psychosocial Screening:     Initial Psych Review & Screening - 05/09/15 0900    Initial Review   Current issues with History of Depression   Family Dynamics   Good Support System? Yes   Comments Mr Calcaterra has a history of depression and has counciled with therapists. He has good support from his daughter and mother and has his [place] faith. He has a history of past drug use and chronic pain.   Barriers   Psychosocial barriers to participate in program The patient should benefit from training in stress management and relaxation.   Screening Interventions   Interventions Encouraged to exercise;Program counselor consult      Quality of Life Scores:     Quality of Life - 05/09/15 0900    Quality of Life Scores   Health/Function Pre 5.56 %    Socioeconomic Pre 13.71 %   Psych/Spiritual Pre 6.29 %   Family Pre 22.5 %   GLOBAL Pre 9.38 %      PHQ-9:     Recent Review Flowsheet Data    Depression screen [facility] 2/9 05/09/2015   Decreased Interest 3   Down, Depressed, Hopeless 3   PHQ - 2 Score 6   Altered sleeping 3   Tired, decreased energy 3   Change in appetite 3   Feeling bad or failure about yourself  3   Trouble concentrating 1   Moving slowly or fidgety/restless 1   Suicidal thoughts 0   PHQ-9 Score 20   Difficult doing work/chores Very difficult      Psychosocial Evaluation and Intervention:     Psychosocial Evaluation - 05/17/15 1037    Psychosocial Evaluation & Interventions   Interventions Stress management education;Relaxation education;Encouraged to exercise with the program and follow exercise prescription   Comments Counselor met with Mr. Artley today for initial psychosocial evaluation.  He is a 59 yr old gentleman who suffers from COPD and many other health issues, including Fibromyalgia, arthritis and chronic pain as a result.  Mr. Ponciano has a daughter who is a strong support for him.  He is also actively involved in his local faith community.  Mr. Canepa reports not sleeping well due to sleep apnea, although he has a CPAP, but reports his chronic pain prevents restful sleep.  He also states he  has a history of depression and anxiety but no current symptoms.  However, his PHQ-9 scores indicate with a score of 20, that depression is a current issue.  Mr. Korber stated he had been on medication for his mood several years ago, but has not been treated since that time.  His current stressors are his health, not sleeping, and his chronic pain.  Counselor recommended he speak with his doctor about possibly addressing his mood issues with medication and for Mr. Vansickle to also consider seeing a counselor again to provide support during this difficult time.  He agreed to do so.  One of his goals is to  lose weight, so he is encouraged to exercise consistently and meet with the dietician to help address this goal.  Counselor will folllow  up with Mr. Dantes on these recommendations.     Continued Psychosocial Services Needed Yes  Mr. Gasaway will benefit from a medication evaluation for his current depression and anxiety, as well as seek out a counselor for supportive services.  He will benefit from the psychoeducational components of this program as well.        Psychosocial Re-Evaluation:  Education: Education Goals: Education classes will be provided on a weekly basis, covering required topics. Participant will state understanding/return demonstration of topics presented.  Learning Barriers/Preferences:     Learning Barriers/Preferences - 05/09/15 0900    Learning Barriers/Preferences   Learning Barriers None   Learning Preferences Group Instruction;Individual Instruction;Pictoral;Skilled Demonstration;Verbal Instruction;Video;Written Material      Education Topics: Initial Evaluation Education: - Verbal, written and demonstration of respiratory meds, RPE/PD scales, oximetry and breathing techniques. Instruction on use of nebulizers and MDIs: cleaning and proper use, rinsing mouth with steroid doses and importance of monitoring MDI activations.          Pulmonary Rehab from 05/31/2015 in [place]   Date  05/09/15   Educator  LB   Instruction Review Code  2- meets goals/outcomes      General Nutrition Guidelines/Fats and Fiber: -Group instruction provided by verbal, written material, models and posters to present the general guidelines for heart healthy nutrition. Gives an explanation and review of dietary fats and fiber.   Controlling Sodium/Reading Food Labels: -Group verbal and written material supporting the discussion of sodium use in heart healthy nutrition. Review and explanation with models, verbal and written materials for  utilization of the food label.   Exercise Physiology & Risk Factors: - Group verbal and written instruction with models to review the exercise physiology of the cardiovascular system and associated critical values. Details cardiovascular disease risk factors and the goals associated with each risk factor.   Aerobic Exercise & Resistance Training: - Gives group verbal and written discussion on the health impact of inactivity. On the components of aerobic and resistive training programs and the benefits of this training and how to safely progress through these programs.   Flexibility, Balance, General Exercise Guidelines: - Provides group verbal and written instruction on the benefits of flexibility and balance training programs. Provides general exercise guidelines with specific guidelines to those with heart or lung disease. Demonstration and skill practice provided.   Stress Management: - Provides group verbal and written instruction about the health risks of elevated stress, cause of high stress, and healthy ways to reduce stress.   Depression: - Provides group verbal and written instruction on the correlation between heart/lung disease and depressed mood, treatment options, and the stigmas associated with seeking treatment.   Exercise & Equipment Safety: - Individual verbal instruction and demonstration of equipment use and safety with use of the equipment.      Pulmonary Rehab from 05/31/2015 in [place]   Date  05/15/15   Educator  LB   Instruction Review Code  2- meets goals/outcomes      Infection Prevention: - Provides verbal and written material to individual with discussion of infection control including proper hand washing and proper equipment cleaning during exercise session.      Pulmonary Rehab from 05/31/2015 in [place]   Date  05/15/15   Educator  LB   Instruction Review Code  2-  meets goals/outcomes      Falls Prevention: - Provides verbal and written material to  individual with discussion of falls prevention and safety.      Pulmonary Rehab from 05/31/2015 in [place]   Date  05/09/15   Educator  LB   Instruction Review Code  2- meets goals/outcomes      Diabetes: - Individual verbal and written instruction to review signs/symptoms of diabetes, desired ranges of glucose level fasting, after meals and with exercise. Advice that pre and post exercise glucose checks will be done for 3 sessions at entry of program.   Chronic Lung Diseases: - Group verbal and written instruction to review new updates, new respiratory medications, new advancements in procedures and treatments. Provide informative websites and "800" numbers of self-education.   Lung Procedures: - Group verbal and written instruction to describe testing methods done to diagnose lung disease. Review the outcome of test results. Describe the treatment choices: Pulmonary Function Tests, ABGs and oximetry.   Energy Conservation: - Provide group verbal and written instruction for methods to conserve energy, plan and organize activities. Instruct on pacing techniques, use of adaptive equipment and posture/positioning to relieve shortness of breath.      Pulmonary Rehab from 05/31/2015 in [place]   Date  05/17/15   Educator  SW   Instruction Review Code  2- meets goals/outcomes      Triggers: - Group verbal and written instruction to review types of environmental controls: home humidity, furnaces, filters, dust mite/pet prevention, HEPA vacuums. To discuss weather changes, air quality and the benefits of nasal washing.   Exacerbations: - Group verbal and written instruction to provide: warning signs, infection symptoms, calling MD promptly, preventive modes, and value of vaccinations. Review: effective airway clearance,  coughing and/or vibration techniques. Create an Sports administrator.   Oxygen: - Individual and group verbal and written instruction on oxygen therapy. Includes supplement oxygen, available portable oxygen systems, continuous and intermittent flow rates, oxygen safety, concentrators, and Medicare reimbursement for oxygen.      Pulmonary Rehab from 05/31/2015 in [place]   Date  05/09/15   Educator  LB   Instruction Review Code  2- meets goals/outcomes      Respiratory Medications: - Group verbal and written instruction to review medications for lung disease. Drug class, frequency, complications, importance of spacers, rinsing mouth after steroid MDI's, and proper cleaning methods for nebulizers.      Pulmonary Rehab from 05/31/2015 in [place]   Date  05/09/15   Educator  LB   Instruction Review Code  2- meets goals/outcomes      AED/CPR: - Group verbal and written instruction with the use of models to demonstrate the basic use of the AED with the basic ABC's of resuscitation.   Breathing Retraining: - Provides individuals verbal and written instruction on purpose, frequency, and proper technique of diaphragmatic breathing and pursed-lipped breathing. Applies individual practice skills.      Pulmonary Rehab from 05/31/2015 in [place]   Date  05/09/15   Educator  LB   Instruction Review Code  2- meets goals/outcomes      Anatomy and Physiology of the Lungs: - Group verbal and written instruction with the use of models to provide basic lung anatomy and physiology related to function, structure and complications of lung disease.   Heart Failure: - Group verbal and written instruction on the basics of heart failure: signs/symptoms, treatments, explanation of ejection fraction, enlarged heart and cardiomyopathy.  Sleep Apnea: - Individual verbal and written  instruction to review Obstructive Sleep Apnea. Review of risk factors, methods for diagnosing and types of masks and machines for OSA.      Pulmonary Rehab from 05/31/2015 in [place]   Date  05/09/15   Educator  LB   Instruction Review Code  2- meets goals/outcomes      Anxiety: - Provides group, verbal and written instruction on the correlation between heart/lung disease and anxiety, treatment options, and management of anxiety.   Relaxation: - Provides group, verbal and written instruction about the benefits of relaxation for patients with heart/lung disease. Also provides patients with examples of relaxation techniques.   Knowledge Questionnaire Score:     Knowledge Questionnaire Score - 05/09/15 0900    Knowledge Questionnaire Score   Pre Score -3      Personal Goals and Risk Factors at Admission:     Personal Goals and Risk Factors at Admission - 05/09/15 0900    Personal Goals and Risk Factors on Admission    Weight Management Yes   Intervention Learn and follow the exercise and diet guidelines while in the program. Utilize the nutrition and education classes to help gain knowledge of the diet and exercise expectations in the program  Plans to meet with dietitian; likes fried foods and snacks all day, especially since he stopped smoking; needs encouragement to drink more fluids - only 3 glasses of water/day, some coffee   Admit Weight 277 lb (125.646 kg)   Goal Weight 200 lb (90.719 kg)   Increase Aerobic Exercise and Physical Activity Yes   Intervention While in program, learn and follow the exercise prescription taught. Start at a low level workload and increase workload after able to maintain previous level for 30 minutes. Increase time before increasing intensity.  Mr Gustafson would like to be more active, especially daily activites. He has a [employer] and in the past, he exercised in the pool - walking and swimming,  because less pain.   Understand more about Heart/Pulmonary Disease. Yes   Intervention --  Mr Alcock has COPD and CHF and would like tolearn more about these diseases and management.    Improve shortness of breath with ADL's Yes   Intervention While in program, learn and follow the exercise prescription taught. Start at a low level workload and increase workload ad advised by the exercise physiologist. Increase time before increasing intensity.  Mr Brothers has severe shortness of breath with activity . His SOB questionnaire was scored high at 78.   Develop more efficient breathing techniques such as purse lipped breathing and diaphragmatic breathing; and practicing self-pacing with activity Yes   Intervention While in program, learn and utilize the specific breathing techniques taught to you. Continue to practice and use the techniques as needed.  Mr Charlesworth has been using PLB, but did not know it was a named technique. I encouraged him to use PLB with activity in class and at home.   Increase knowledge of respiratory medications and ability to use respiratory devices properly.  Yes   Intervention While in program learn and demonstrate appropriate use of your oxygen therapy by increasing flow with exertion, manage oxygen tank operation, including continuous and intermittent flow.  Understanding oxygen is a drug ordered by your physician.;While in program, learn to administer MDI, nebulizer, and spacer properly.;Learn to take respiratory medicine as ordered.;While in program, learn to Clean MDI, nebulizers, and spacers properly.  Mr Spiering takes Advair,  Spiriva, and Proair. I educated him on a spacer, but will give him one when ordered. He wears 2l/m Oxygen and uses a small gas portable tank from [employer]. He has CPAP, but cannot tolerate a full mask  - plans to change  mask.   Hypertension Yes   Goal Participant will see blood pressure controlled within the values of 140/73m/Hg or within  value directed by their physician.   Intervention Provide nutrition & aerobic exercise along with prescribed medications to achieve BP 140/90 or less.      Personal Goals and Risk Factors Review:      Goals and Risk Factor Review      05/15/15 1000 05/17/15 1038 05/29/15 1000 05/29/15 1026 05/31/15 1000   Increase Aerobic Exercise and Physical Activity   Goals Progress/Improvement seen     Yes    Comments    JAvinashhas made significant increases in exercise very quickly.  Noticed both by patient as well as staff.    Take Less Medication   Goals Progress/Improvement seen  No      Comments  Oxygen has been increased to 3 L/m during exertion, 2 L/m at rest      Understand more about Heart/Pulmonary Disease   Goals Progress/Improvement seen    Yes     Comments   Discussed with Mr DCorpusabout his COPD. He wants to learn more about this condition. His physician did mention a diagnosis of asthma. He did have asthma as a child and out grew it, but he needs more understanding of COPD.     Improve shortness of breath with ADL's   Goals Progress/Improvement seen    Yes     Comments   Mr DMceneryhas shortness of breath with activity and does state that his pain causes shortness of breath as well. His PD Scales are acceptable at "3".     Breathing Techniques   Goals Progress/Improvement seen  Yes   Yes    Comments Mr DClinkwas using PLB with his exercise goals - it helps with his shortness of breath and pain.   working on PLB with exercise    Increase knowledge of respiratory medications   Goals Progress/Improvement seen    Yes     Comments   Mr DSaucedahas been using 2l/m with his exercise goals and has maintained acceptable O2Sat's, but he is able to increase his oxygen to 3l/m as needed.      Hypertension   Goal    Participant will see blood pressure controlled within the values of 140/938mHg or within value directed by their physician. --  Mr DiBrockmannrrived to class today with  increased fluid retention and high BP of  190/98. After sitting for several minutes, the BP did come down to 158/96. Mr DiLuckeys physician was called and education was done on salt intake.       Personal Goals Discharge (Final Personal Goals and Risk Factors Review):      Goals and Risk Factor Review - 05/31/15 1000    Hypertension   Goal --  Mr DiBerberianrrived to class today with increased fluid retention and high BP of  190/98. After sitting for several minutes, the BP did come down to 158/96. Mr DiOrihuelas physician was called and education was done on salt intake.       ITP Comments:     ITP Comments      05/31/15 1139  ITP Comments Patient presented today with more fluid buildup Did not feel like exercising.  Discussed possible causes of fluid retention, and then the changes to make so it would not continue to happen.  Patient verbalized understanding, and will return Friday.          Comments: 30 day review.

## 2015-06-05 NOTE — Telephone Encounter (Signed)
lmov to see if patient can come see Dr Rockey Situ tmrw at [phone]

## 2015-06-06 ENCOUNTER — Ambulatory Visit: Payer: Medicaid Other | Admitting: Cardiovascular Disease

## 2015-06-09 ENCOUNTER — Encounter: Payer: Medicaid Other | Admitting: *Deleted

## 2015-06-09 DIAGNOSIS — J449 Chronic obstructive pulmonary disease, unspecified: Secondary | ICD-10-CM | POA: Diagnosis present

## 2015-06-09 NOTE — Progress Notes (Signed)
Daily Session Note  Patient Details  Name: Hunter White MRN: [id number] Date of Birth: [birth year] Referring Provider:  Vilinda Boehringer, MD  Encounter Date: 06/09/2015  Check In:     Session Check In - 06/09/15 1029    Check-In   Staff Present Candiss Norse, MS, ACSM CEP, Exercise Physiologist;Susanne Bice, RN, BSN, CCRP;Stacey Blanch Media, RRT, RCP, Respiratory Therapist   ER physicians immediately available to respond to emergencies [facility] immediately available ER MD   Physician(s) Marcelene Butte and Burlene Arnt   Medication changes reported     No   Fall or balance concerns reported    No   Warm-up and Cool-down Performed on first and last piece of equipment   VAD Patient? No   Pain Assessment   Currently in Pain? No/denies   Multiple Pain Sites No         Goals Met:  Proper associated with RPD/PD & O2 Sat Independence with exercise equipment Using PLB without cueing & demonstrates good technique Exercise tolerated well Strength training completed today  Goals Unmet:  Not Applicable  Goals Comments: Patient completed exercise prescription and all exercise goals during rehab session. The exercise was tolerated well and the patient is progressing in the program. Personal and exercise goals expected to be met in 30 more sessions. Progress on specific individualized goals will be charted in patient's ITP. Upon completion of the program the patient will be comfortable managing exercise goals and progression on their own.    Dr. Emily Filbert is Medical Director for [place] and [facility].

## 2015-06-19 ENCOUNTER — Telehealth: Payer: Self-pay

## 2015-06-19 NOTE — Telephone Encounter (Signed)
Hunter White has been out sick, been to the doctor and has several more appts.  Will try to attend this Weds.

## 2015-06-20 ENCOUNTER — Ambulatory Visit: Payer: Medicaid Other | Admitting: Internal Medicine

## 2015-06-21 ENCOUNTER — Ambulatory Visit (INDEPENDENT_AMBULATORY_CARE_PROVIDER_SITE_OTHER): Payer: Medicaid Other | Admitting: Cardiovascular Disease

## 2015-06-21 ENCOUNTER — Encounter: Payer: Self-pay | Admitting: Cardiovascular Disease

## 2015-06-21 VITALS — BP 149/89 | HR 99 | Ht 70.0 in | Wt 283.2 lb

## 2015-06-21 DIAGNOSIS — R0602 Shortness of breath: Secondary | ICD-10-CM | POA: Diagnosis not present

## 2015-06-21 DIAGNOSIS — R600 Localized edema: Secondary | ICD-10-CM

## 2015-06-21 DIAGNOSIS — G4733 Obstructive sleep apnea (adult) (pediatric): Secondary | ICD-10-CM

## 2015-06-21 DIAGNOSIS — I5032 Chronic diastolic (congestive) heart failure: Secondary | ICD-10-CM

## 2015-06-21 DIAGNOSIS — R635 Abnormal weight gain: Secondary | ICD-10-CM

## 2015-06-21 DIAGNOSIS — Z720 Tobacco use: Secondary | ICD-10-CM

## 2015-06-21 DIAGNOSIS — R0902 Hypoxemia: Secondary | ICD-10-CM

## 2015-06-21 MED ORDER — POTASSIUM CHLORIDE ER 10 MEQ PO TBCR: 10.0000 meq | EXTENDED_RELEASE_TABLET | Freq: Every day | ORAL | Status: AC

## 2015-06-21 MED ORDER — FUROSEMIDE 40 MG PO TABS: 40.0000 mg | ORAL_TABLET | Freq: Every day | ORAL | Status: DC

## 2015-06-21 NOTE — Assessment & Plan Note (Signed)
Recent echocardiogram showing moderately elevated right heart pressures  Recommended he take HCTZ in the morning, 30 minutes later take Lasix 40 mg daily with potassium  Would hold off on the evening HCTZ as he  Is having disrupted sleep at nighttime from urination  Suggested if leg edema gets worse, take Lasix after lunch with potassium  We did spend significant time talking about his fluid intake, salt intake  we'll need to watch his BMP relatively closely  He does not have a scale at home, we'll contact the CHF clinic to see if they have a scale

## 2015-06-21 NOTE — Assessment & Plan Note (Signed)
He reports weight gain of 70 pounds over the past year since he stopped smoking. This is likely contributing to much of his shortness of breath in addition to his COPD , diastolic CHF.  Diet discussed with him in detail, recommended a low carbohydrate diet

## 2015-06-21 NOTE — Assessment & Plan Note (Signed)
Stressed importance of CPAP  compliance

## 2015-06-21 NOTE — Assessment & Plan Note (Signed)
Pitting edema on exam, seems to fluctuate with his fluid status  We'll start Lasix daily,  Twice a day dosing if needed  If no augmentation of his urination, could change to torsemide

## 2015-06-21 NOTE — Assessment & Plan Note (Signed)
On chronic oxygen,  Significant symptoms of shortness of breath likely multifactorial  in his follow-up visit, will consider ischemia workup  no CT scan of the chest to review for coronary calcifications

## 2015-06-21 NOTE — Progress Notes (Signed)
Patient ID: Hunter White, male    DOB: [birth year], 59 y.o.   MRN: [id number]  HPI Comments:  Hunter White is a pleasant 59 year old male with severe COPD and severe OSA, Former smoker , 40 pack year, quit in 01/2015,  On  cpap, 2L O2  presenting for worsening shortness of breath, leg swelling   He reports symptoms have been getting worse over the past year , severe recently with dramatic leg swelling, foot swelling  Some days he feels like his feet are balloons and he is walking on blisters  He has gained 80 pounds or so since he stopped smoking earlier in 2016  Poor diet resulting in dramatic weight gain   never had swelling like this before  In an effort to improve his leg swelling, he has been taking HCTZ twice a day with no significant improvement  Symptoms certainly worse on exertion  Denies drinking too much during the daytime, does eat lots of popsicles   Echocardiogram done  Recently showing normal ejection fraction, moderately elevated right heart pressures   unable to participate in pulmonary rehabilitation , Denied by Medicaid  Trying to do water aerobics  chronic cough  he does report having periods of tachycardia at home, sometimes rate 120s with minimal exertion, other times heart rate in the 70-80 range   EKG on today's visit shows normal sinus rhythm with rate 99 bpm, no significant ST or T-wave changes   No Known Allergies  Current Outpatient Prescriptions on File Prior to Visit  Medication Sig Dispense Refill  . acetaminophen (TYLENOL) 650 MG CR tablet Take 1,300 mg by mouth every 8 (eight) hours as needed for pain.    Marland Kitchen albuterol (PROVENTIL HFA;VENTOLIN HFA) 108 (90 BASE) MCG/ACT inhaler Inhale 1-2 puffs into the lungs every 4 (four) hours as needed for wheezing or shortness of breath. 1 Inhaler 6  . esomeprazole (NEXIUM) 40 MG capsule Take 40 mg by mouth 2 (two) times daily before a meal.    . Fluticasone-Salmeterol (ADVAIR) 500-50 MCG/DOSE AEPB Inhale 1 puff  into the lungs 2 (two) times daily. 60 each 4  . hydrochlorothiazide (HYDRODIURIL) 25 MG tablet Take 25 mg by mouth daily.    Marland Kitchen lisinopril (PRINIVIL,ZESTRIL) 40 MG tablet Take 40 mg by mouth daily.    . pregabalin (LYRICA) 50 MG capsule Take 150 mg by mouth 2 (two) times daily.     Marland Kitchen tiotropium (SPIRIVA) 18 MCG inhalation capsule Place 1 capsule (18 mcg total) into inhaler and inhale daily. 30 capsule 6   No current facility-administered medications on file prior to visit.    Past Medical History  Diagnosis Date  . Hypertension   . Fibromyalgia   . COPD (chronic obstructive pulmonary disease) ([place])   . Depression   . GERD (gastroesophageal reflux disease)   . Arthritis     Past Surgical History  Procedure Laterality Date  . Cholecystectomy    . Knee arthroscopy Left   . Vasectomy    . Hernia repair    . Shoulder surgery Right   . Irrigation and debridement sebaceous cyst N/A 12/07/2014    Procedure: IRRIGATION AND DEBRIDEMENT SEBACEOUS CYST;  Surgeon: Dia Crawford III, MD;  Location: [facility] ORS;  Service: General;  Laterality: N/A;    Social History  reports that he quit smoking about 4 months ago. His smoking use included Cigarettes. He has a 8.75 pack-year smoking history. He quit smokeless tobacco use about 41 years ago. He reports that he does  not drink alcohol or use illicit drugs.  Family History family history includes Cancer in his father.   Review of Systems  Constitutional: Negative.   HENT: Negative.   Respiratory: Positive for cough and shortness of breath.   Cardiovascular: Positive for leg swelling.  Gastrointestinal: Negative.   Musculoskeletal: Negative.   Neurological: Negative.   Hematological: Negative.   Psychiatric/Behavioral: Negative.   All other systems reviewed and are negative.   BP 149/89 mmHg  Pulse 99  Ht 5\' 10"  (1.778 m)  Wt 283 lb 4 oz (128.481 kg)  BMI 40.64 kg/m2  SpO2 94%  Physical Exam  Constitutional: He is oriented to person,  place, and time. He appears well-developed and well-nourished.   Morbidly obese  HENT:  Head: Normocephalic.  Nose: Nose normal.  Mouth/Throat: Oropharynx is clear and moist.  Eyes: Conjunctivae are normal. Pupils are equal, round, and reactive to light.  Neck: Normal range of motion. Neck supple. No JVD present.  Cardiovascular: Normal rate, regular rhythm, normal heart sounds and intact distal pulses.  Exam reveals no gallop and no friction rub.   No murmur heard.  Trace pitting edema to the mid shins  Pulmonary/Chest: Effort normal. No respiratory distress. He has decreased breath sounds. He has no wheezes. He has rales. He exhibits no tenderness.  Abdominal: Soft. Bowel sounds are normal. He exhibits no distension. There is no tenderness.  Musculoskeletal: Normal range of motion. He exhibits edema. He exhibits no tenderness.  Lymphadenopathy:    He has no cervical adenopathy.  Neurological: He is alert and oriented to person, place, and time. Coordination normal.  Skin: Skin is warm and dry. No rash noted. No erythema.  Psychiatric: He has a normal mood and affect. His behavior is normal. Judgment and thought content normal.

## 2015-06-21 NOTE — Assessment & Plan Note (Signed)
Reports he stopped smoking in early 2016

## 2015-06-21 NOTE — Patient Instructions (Addendum)
You are doing well.  Take HCTZ in the AM  then 30 minutes later, take lasix one pill Take with potassium one a day  Please keep your fluid intake down  Please call us if you have new issues that need to be addressed before your next appt.  Your physician wants you to follow-up in: 1 month

## 2015-06-27 ENCOUNTER — Encounter: Payer: Self-pay | Admitting: Respiratory Therapy

## 2015-06-27 DIAGNOSIS — J449 Chronic obstructive pulmonary disease, unspecified: Secondary | ICD-10-CM

## 2015-07-05 ENCOUNTER — Encounter: Payer: Medicaid Other | Attending: Internal Medicine

## 2015-07-05 ENCOUNTER — Telehealth: Payer: Self-pay | Admitting: Respiratory Therapy

## 2015-07-05 DIAGNOSIS — J449 Chronic obstructive pulmonary disease, unspecified: Secondary | ICD-10-CM | POA: Insufficient documentation

## 2015-07-05 NOTE — Telephone Encounter (Signed)
Called Mr Stpaul to check on him and left a message; he last attended 06/09/2015

## 2015-07-12 ENCOUNTER — Telehealth: Payer: Self-pay

## 2015-07-12 NOTE — Telephone Encounter (Signed)
Hunter White has been sick recently, also is having some car trouble as well.  He will return when he feels able.

## 2015-07-20 ENCOUNTER — Encounter: Payer: Self-pay | Admitting: Internal Medicine

## 2015-07-20 ENCOUNTER — Telehealth: Payer: Self-pay | Admitting: Cardiovascular Disease

## 2015-07-20 ENCOUNTER — Ambulatory Visit: Payer: Medicaid Other | Admitting: Cardiovascular Disease

## 2015-07-20 ENCOUNTER — Ambulatory Visit (INDEPENDENT_AMBULATORY_CARE_PROVIDER_SITE_OTHER): Payer: Medicaid Other | Admitting: Internal Medicine

## 2015-07-20 ENCOUNTER — Other Ambulatory Visit: Payer: Self-pay | Admitting: *Deleted

## 2015-07-20 VITALS — BP 138/72 | HR 94 | Ht 70.0 in | Wt 287.0 lb

## 2015-07-20 DIAGNOSIS — J449 Chronic obstructive pulmonary disease, unspecified: Secondary | ICD-10-CM | POA: Diagnosis not present

## 2015-07-20 DIAGNOSIS — J441 Chronic obstructive pulmonary disease with (acute) exacerbation: Secondary | ICD-10-CM

## 2015-07-20 DIAGNOSIS — G4733 Obstructive sleep apnea (adult) (pediatric): Secondary | ICD-10-CM

## 2015-07-20 DIAGNOSIS — R0902 Hypoxemia: Secondary | ICD-10-CM

## 2015-07-20 MED ORDER — PREDNISONE 20 MG PO TABS: 20.0000 mg | ORAL_TABLET | Freq: Every day | ORAL | Status: DC

## 2015-07-20 MED ORDER — IPRATROPIUM-ALBUTEROL 0.5-2.5 (3) MG/3ML IN SOLN
6.0000 mL | Freq: Once | RESPIRATORY_TRACT | Status: AC
  Administered 2015-07-20: 6 mL via RESPIRATORY_TRACT

## 2015-07-20 MED ORDER — FUROSEMIDE 40 MG PO TABS: 40.0000 mg | ORAL_TABLET | Freq: Two times a day (BID) | ORAL | Status: DC | PRN

## 2015-07-20 MED ORDER — AZITHROMYCIN 250 MG PO TABS: ORAL_TABLET | ORAL | Status: DC

## 2015-07-20 NOTE — Telephone Encounter (Signed)
°*  STAT* If patient is at the pharmacy, call can be transferred to refill team.   1. Which medications need to be refilled? (please list name of each medication and dose if known)  Lasix 40 mg po daily - patient was told to increase to 2 pills per day and needs refill to cover  Increase change   2. Which pharmacy/location (including street and city if local pharmacy) is medication to be sent to? [facility]  [address] [place]   3. Do they need a 30 day or 90 day supply? [place]

## 2015-07-20 NOTE — Patient Instructions (Addendum)
Follow up with Dr. Stevenson Clinch in: 24months - CT chest with contrast prior to follow up visit - BMP prior to follow up visit - cont with supplemental O2 - 3L continuously - cont with CPAP use - cont with exercise, or pulm rehab recommendations.  - cont with current inhalers.  - Prednisone 20mg , 1 tab daily with breakfast x 5 days - Zpak - use as directed.

## 2015-07-20 NOTE — Assessment & Plan Note (Signed)
Today patient with COPD exacerbation:  Increased cough, greenish sputum production, dyspnea on exertion increased, increased use of supplemental O2.  at today's visit he required 2 nebulizer treatments , and had improvement in his respiratory status afterwards.  Plan:  - Zpak, prednisone 20mg  daily x 5 days.

## 2015-07-20 NOTE — Assessment & Plan Note (Signed)
PSG: 01/17/2015 Titration Study: 02/17/15 -Severe obstructive sleep apnea, RDI 44.4 - CPAP 15cm H20 with 3L O2 at night and with naps.  - patient counseled on using cpap nightly.   OSA  Discussed sleep data and reviewed with patient.  Encouraged proper weight management.  Excessive weight may contribute to snoring.  Monitor sedative use.  Discussed driving precautions and its relationship with hypersomnolence.  Discussed operating dangerous equipment and its relationship with hypersomnolence.  Discussed sleep hygiene, and benefits of a fixed sleep waked time.  The importance of getting eight or more hours of sleep discussed with patient.  Discussed limiting the use of the computer and television before bedtime.  Decrease naps during the day, so night time sleep will become enhanced.  Limit caffeine, and sleep deprivation.  HTN, stroke, and heart failure are potential risk factors.

## 2015-07-20 NOTE — Assessment & Plan Note (Signed)
Abnormal 47mwt at last visit Cont with 3L O2 continuously

## 2015-07-20 NOTE — Progress Notes (Signed)
[facility] Consultation      MRN# [id number] Hunter White [birth year]   CC: Chief Complaint  Patient presents with  . Follow-up    pt. states breathing has worsen. increased DOB. occ. dry cough. wheezing. chest pain/tightness. 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  Former smoker , 40 pack year, quit in 01/2015. On Advair/spiriva, cpap, 2L O2 at night.   ROV 02/08/15 Patient presents for follow up visit. Quit smoking 2 weeks ago. Heat makes dyspnea worst and activity. Activity is difficult also due to hip and chronic back pain issues.  Currently on 2L of O2 at night, this causes some sinus drainage (clear).   Plan: -Advair 500/50 -Spiriva Respimat -Albuterol rescue inhaler -Referral to pulmonary rehabilitation -Continue to avoid tobacco -Diet, exercise, weight loss.   ROV 03/22/15: Presents today for a follow-up visit, since his last visit he's had a CPAP titration study. His titration study showed that he requires 15 cm H2O. States that his breathing has improved since he stopped smoking. States that he is on 2 fluid pills but still having lower ext. Swelling.  Overall with good improvement in his breathing status. Medicaid denied his pulm rehab. Currently attending the [facility], doing water exercises, 5/7 days.  Has had some weight gain.    ROV 05/2015 Patient presents today for a follow-up visit. Since his last visit he's had one acute care visit with [name],  In early October, presented with worsening shortness of breath and sputum production, and diagnosed with COPD exacerbation, was started on antibiotics and steroids.  patient states today that overall his breathing is doing fairly well, he is on 3 L of oxygen continuously, he also has a facemask eval with [facility] for CPAP use. Today his major concerns of bilateral swelling of his lower legs  and tingling in the bottom of his feet, he still has a chronic shortness of breath, and mild intermittent cough which is mildly productive at times.  He will see his DME tomorrow due to his facemask for his CPAP machine not fitting properly. Plan: -Advair 500/50 -Spiriva Respimat -Albuterol rescue inhaler -Continue to avoid tobacco -Diet, exercise, weight loss. -cont with cpap nightly - 2L o2 continuously   Events since last clinic visit: patient presents today for follow-up visit of his COPD. Since his last visit he had one outpatient visit with pulmonary clinic and was advised to continue with inhalers and continuous oxygen. At today's visit, he states over the past month he's had intermittent shortness of breath, requiring increase of his supplemental O2 from 2 L sometimes to 3 L, along with increased shortness of breath, along with sputum production greenish in nature.  he was given 2 DuoNeb treatments  During today's visit  patient states he was attending pulmonary rehabilitation in December 2016, was doing initially well, but started developing knee issues , for which he follows orthopedics. Due to his knee issues and increased shortness of breath he had to stop pulmonary rehabilitation early.  also, noted upon arrival his O2 saturations on 2 L of continuous oxygen was 84%, after increasing oxygen to 3 L, his O2 sat rose to 94%.  Medication:   Current Outpatient Rx  Name  Route  Sig  Dispense  Refill  . acetaminophen (TYLENOL) 650 MG CR tablet   Oral   Take 1,300 mg by mouth every  8 (eight) hours as needed for pain.         Marland Kitchen albuterol (PROVENTIL HFA;VENTOLIN HFA) 108 (90 BASE) MCG/ACT inhaler   Inhalation   Inhale 1-2 puffs into the lungs every 4 (four) hours as needed for wheezing or shortness of breath.   1 Inhaler   6   . esomeprazole (NEXIUM) 40 MG capsule   Oral   Take 40 mg by mouth 2 (two) times daily before a meal.         . Fluticasone-Salmeterol (ADVAIR)  500-50 MCG/DOSE AEPB   Inhalation   Inhale 1 puff into the lungs 2 (two) times daily.   60 each   4   . hydrochlorothiazide (HYDRODIURIL) 25 MG tablet   Oral   Take 25 mg by mouth daily.         Marland Kitchen lisinopril (PRINIVIL,ZESTRIL) 40 MG tablet   Oral   Take 40 mg by mouth daily.         . potassium chloride (K-DUR) 10 MEQ tablet   Oral   Take 1 tablet (10 mEq total) by mouth daily.   90 tablet   3   . pregabalin (LYRICA) 50 MG capsule   Oral   Take 150 mg by mouth 2 (two) times daily.          Marland Kitchen tiotropium (SPIRIVA) 18 MCG inhalation capsule   Inhalation   Place 1 capsule (18 mcg total) into inhaler and inhale daily.   30 capsule   6   . azithromycin (ZITHROMAX) 250 MG tablet      Take 2 tabs on day 1 and then 1 tab daily till gone.   6 each   0   . furosemide (LASIX) 40 MG tablet   Oral   Take 1 tablet (40 mg total) by mouth 2 (two) times daily as needed.   180 tablet   3   . predniSONE (DELTASONE) 20 MG tablet   Oral   Take 1 tablet (20 mg total) by mouth daily.   5 tablet   0     Daily for 5 days      Review of Systems  Constitutional: Negative for fever and chills.  Eyes: Negative for blurred vision and double vision.  Respiratory: Positive for cough, sputum production and shortness of breath. Negative for hemoptysis and wheezing.        Chronic sob, but improving since stopped smoking  Cardiovascular: Positive for PND. Negative for chest pain and palpitations.  Gastrointestinal: Negative for heartburn, nausea and vomiting.  Skin: Negative for rash.  Neurological: Negative for headaches.  Endo/Heme/Allergies: Negative for environmental allergies. Does not bruise/bleed easily.      Allergies:  Review of patient's allergies indicates no known allergies.  Physical Examination:  VS: BP 138/72 mmHg  Pulse 94  Ht 5\' 10"  (1.778 m)  Wt 287 lb (130.182 kg)  BMI 41.18 kg/m2  SpO2 92%  General Appearance: No distress  HEENT: PERRLA, no ptosis,  no other lesions noticed Pulmonary:  PreBronchodilator -  Shallow breath sounds, tight, no wheezing, decreased breath sounds at the bases , mild use of accessory muscles Post bronchdilator-  Improved breath sounds, movement of air to the bases, no wheezing.  Cardiovascular:  Normal S1,S2.  No m/r/g.     Abdomen:Exam: Benign, Soft, non-tender, No masses  Skin:   warm, no rashes, no ecchymosis  Extremities: normal, no cyanosis, clubbing, warm with normal capillary refill.  2+ pitting edema Bilateral LE    Assessment  and Plan: Hypoxia Abnormal 65mwt at last visit Cont with 3L O2 continuously        COPD (chronic obstructive pulmonary disease) Pulmonary function testing with severe, very severe, obstruction, FEV1 29%, postBD FEV1 35% Patient has stopped smoking and has gained significant weight since then, counseled today on diet and exercise.   Patient will require Advair, Spiriva, rescue inhaler Medicaid has finally approved his pulmonary rehab and he was attending 3 times per week, but stopped in December due to knee issues. He plans to restart once ortho has cleared him and he get a mobilizer.  Has joined the [facility], and attending about 5/7 days, doing water exercises.   Patient with other comorbidities such as obesity, hypertension, tobacco abuse. Controlling all his comorbidities will assist in his overall quality of life.  Today he also is being treated for AECOPD.   Plan: -Advair 500/50 -Spiriva Respimat -Albuterol rescue inhaler -Continue to avoid tobacco -Diet, exercise, weight loss. - Zpak, prednisone 20mg  daily x 5 days.          COPD with acute exacerbation ([place])  Today patient with COPD exacerbation:  Increased cough, greenish sputum production, dyspnea on exertion increased, increased use of supplemental O2.  at today's visit he required 2 nebulizer treatments , and had improvement in his respiratory status afterwards.  Plan:  - Zpak, prednisone 20mg  daily  x 5 days.   OSA (obstructive sleep apnea) PSG: 01/17/2015 Titration Study: 02/17/15 -Severe obstructive sleep apnea, RDI 44.4 - CPAP 15cm H20 with 3L O2 at night and with naps.  - patient counseled on using cpap nightly.   OSA  Discussed sleep data and reviewed with patient.  Encouraged proper weight management.  Excessive weight may contribute to snoring.  Monitor sedative use.  Discussed driving precautions and its relationship with hypersomnolence.  Discussed operating dangerous equipment and its relationship with hypersomnolence.  Discussed sleep hygiene, and benefits of a fixed sleep waked time.  The importance of getting eight or more hours of sleep discussed with patient.  Discussed limiting the use of the computer and television before bedtime.  Decrease naps during the day, so night time sleep will become enhanced.  Limit caffeine, and sleep deprivation.  HTN, stroke, and heart failure are potential risk factors.               Updated Medication List Outpatient Encounter Prescriptions as of 07/20/2015  Medication Sig  . acetaminophen (TYLENOL) 650 MG CR tablet Take 1,300 mg by mouth every 8 (eight) hours as needed for pain.  Marland Kitchen albuterol (PROVENTIL HFA;VENTOLIN HFA) 108 (90 BASE) MCG/ACT inhaler Inhale 1-2 puffs into the lungs every 4 (four) hours as needed for wheezing or shortness of breath.  . esomeprazole (NEXIUM) 40 MG capsule Take 40 mg by mouth 2 (two) times daily before a meal.  . Fluticasone-Salmeterol (ADVAIR) 500-50 MCG/DOSE AEPB Inhale 1 puff into the lungs 2 (two) times daily.  . hydrochlorothiazide (HYDRODIURIL) 25 MG tablet Take 25 mg by mouth daily.  Marland Kitchen lisinopril (PRINIVIL,ZESTRIL) 40 MG tablet Take 40 mg by mouth daily.  . potassium chloride (K-DUR) 10 MEQ tablet Take 1 tablet (10 mEq total) by mouth daily.  . pregabalin (LYRICA) 50 MG capsule Take 150 mg by mouth 2 (two) times daily.   Marland Kitchen tiotropium (SPIRIVA) 18 MCG inhalation capsule Place 1  capsule (18 mcg total) into inhaler and inhale daily.  . [DISCONTINUED] furosemide (LASIX) 40 MG tablet Take 1 tablet (40 mg total) by mouth daily.  Marland Kitchen azithromycin (  ZITHROMAX) 250 MG tablet Take 2 tabs on day 1 and then 1 tab daily till gone.  . predniSONE (DELTASONE) 20 MG tablet Take 1 tablet (20 mg total) by mouth daily.  . [EXPIRED] ipratropium-albuterol (DUONEB) 0.5-2.5 (3) MG/3ML nebulizer solution 6 mL    No facility-administered encounter medications on file as of 07/20/2015.    Orders for this visit: Orders Placed This Encounter  Procedures  . CT Chest W Contrast    Standing Status: Future     Number of Occurrences:      Standing Expiration Date: 09/16/2016    Order Specific Question:  If indicated for the ordered procedure, I authorize the administration of contrast media per Radiology protocol    Answer:  Yes    Order Specific Question:  Reason for Exam (SYMPTOM  OR DIAGNOSIS REQUIRED)    Answer:  hypoxia, COPD    Order Specific Question:  Preferred imaging location?    Answer:   [facility]  . Basic metabolic panel    Standing Status: Future     Number of Occurrences:      Standing Expiration Date: 07/19/2016  . AMB REFERRAL FOR DME    Referral Priority:  Routine    Referral Type:  Durable Medical Equipment Purchase    Number of Visits Requested:  1    Thank  you for the visitation and for allowing  [facility] to assist in the care of your patient. Our recommendations are noted above.  Please contact us if we can be of further service.  Vilinda Boehringer, MD [facility] Office Number: [phone]

## 2015-07-20 NOTE — Assessment & Plan Note (Signed)
Pulmonary function testing with severe, very severe, obstruction, FEV1 29%, postBD FEV1 35% Patient has stopped smoking and has gained significant weight since then, counseled today on diet and exercise.   Patient will require Advair, Spiriva, rescue inhaler Medicaid has finally approved his pulmonary rehab and he was attending 3 times per week, but stopped in December due to knee issues. He plans to restart once ortho has cleared him and he get a mobilizer.  Has joined the [facility], and attending about 5/7 days, doing water exercises.   Patient with other comorbidities such as obesity, hypertension, tobacco abuse. Controlling all his comorbidities will assist in his overall quality of life.  Today he also is being treated for AECOPD.   Plan: -Advair 500/50 -Spiriva Respimat -Albuterol rescue inhaler -Continue to avoid tobacco -Diet, exercise, weight loss. - Zpak, prednisone 20mg  daily x 5 days.

## 2015-07-24 ENCOUNTER — Encounter: Payer: Self-pay | Admitting: Respiratory Therapy

## 2015-07-24 DIAGNOSIS — J449 Chronic obstructive pulmonary disease, unspecified: Secondary | ICD-10-CM

## 2015-07-24 NOTE — Progress Notes (Signed)
Pulmonary Individual Treatment Plan  Patient Details  Name: Hunter White MRN: [id number] Date of Birth: 06-12-1956 Referring Provider:  Dr Vilinda Boehringer Initial Encounter Date: 05/09/2015  Visit Diagnosis: COPD, severe ([place])  Patient's Home Medications on Admission:  Current outpatient prescriptions:    acetaminophen (TYLENOL) 650 MG CR tablet, Take 1,300 mg by mouth every 8 (eight) hours as needed for pain., Disp: , Rfl:    albuterol (PROVENTIL HFA;VENTOLIN HFA) 108 (90 BASE) MCG/ACT inhaler, Inhale 1-2 puffs into the lungs every 4 (four) hours as needed for wheezing or shortness of breath., Disp: 1 Inhaler, Rfl: 6   azithromycin (ZITHROMAX) 250 MG tablet, Take 2 tabs on day 1 and then 1 tab daily till gone., Disp: 6 each, Rfl: 0   esomeprazole (NEXIUM) 40 MG capsule, Take 40 mg by mouth 2 (two) times daily before a meal., Disp: , Rfl:    Fluticasone-Salmeterol (ADVAIR) 500-50 MCG/DOSE AEPB, Inhale 1 puff into the lungs 2 (two) times daily., Disp: 60 each, Rfl: 4   furosemide (LASIX) 40 MG tablet, Take 1 tablet (40 mg total) by mouth 2 (two) times daily as needed., Disp: 180 tablet, Rfl: 3   hydrochlorothiazide (HYDRODIURIL) 25 MG tablet, Take 25 mg by mouth daily., Disp: , Rfl:    lisinopril (PRINIVIL,ZESTRIL) 40 MG tablet, Take 40 mg by mouth daily., Disp: , Rfl:    potassium chloride (K-DUR) 10 MEQ tablet, Take 1 tablet (10 mEq total) by mouth daily., Disp: 90 tablet, Rfl: 3   predniSONE (DELTASONE) 20 MG tablet, Take 1 tablet (20 mg total) by mouth daily., Disp: 5 tablet, Rfl: 0   pregabalin (LYRICA) 50 MG capsule, Take 150 mg by mouth 2 (two) times daily. , Disp: , Rfl:    tiotropium (SPIRIVA) 18 MCG inhalation capsule, Place 1 capsule (18 mcg total) into inhaler and inhale daily., Disp: 30 capsule, Rfl: 6  Past Medical History: Past Medical History  Diagnosis Date   Hypertension    Fibromyalgia    COPD (chronic obstructive pulmonary disease) (HCC)     Depression    GERD (gastroesophageal reflux disease)    Arthritis     Tobacco Use: History  Smoking status   Former Smoker -- 0.25 packs/day for 35 years   Types: Cigarettes   Quit date: 01/26/2015  Smokeless tobacco   Former Systems developer   Quit date: 11/22/1973    Labs: Recent Review Flowsheet Data    There is no flowsheet data to display.       ADL [facility]:     ADL [facility]      05/09/15 0900       ADL [facility]   ADL Phase Entry     SOB Score total 78     Rest 1     Walk 5     Stairs 5     Bath 5     Dress 5     Shop 5         Pulmonary Function Assessment:     Pulmonary Function Assessment - 05/09/15 0900    Pulmonary Function Tests   RV% 220 %   DLCO% 54 %   Initial Spirometry Results   FVC% 50 %   FEV1% 29 %   FEV1/FVC Ratio 45   Post Bronchodilator Spirometry Results   FVC% 58 %   FEV1% 35 %   FEV1/FVC Ratio 45   Breath   Bilateral Breath Sounds Clear;Decreased   Shortness of Breath Yes;Fear of Shortness of Breath;Limiting activity  Exercise Target Goals:    Exercise Program Goal: Individual exercise prescription set with THRR, safety & activity barriers. Participant demonstrates ability to understand and report RPE using BORG scale, to self-measure pulse accurately, and to acknowledge the importance of the exercise prescription.  Exercise Prescription Goal: Starting with aerobic activity 30 plus minutes a day, 3 days per week for initial exercise prescription. Provide home exercise prescription and guidelines that participant acknowledges understanding prior to discharge.  Activity Barriers & Risk Stratification:     Activity Barriers & Risk Stratification - 05/09/15 0900    Activity Barriers & Risk Stratification   Activity Barriers Joint Problems;Fibromyalgia;Deconditioning;Muscular Weakness;Shortness of Breath   Risk Stratification High      6 Minute Walk:     6 Minute Walk      05/09/15 1415       6 Minute Walk   Phase  Initial     Distance 400 feet     Walk Time 4 minutes     Resting HR 91 bpm     Resting BP 146/76 mmHg     Max Ex. HR 118 bpm     Max Ex. BP 182/80 mmHg     RPE 18     Perceived Dyspnea  8     Symptoms Yes (comment)     Comments SOB, fatigue        Initial Exercise Prescription:     Initial Exercise Prescription - 05/09/15 1400    Date of Initial Exercise Prescription   Date 05/09/15   Treadmill   MPH 2   Grade 0   Minutes 10   Recumbant Bike   Level 2   RPM 40   Watts 20   Minutes 10   NuStep   Level 2   Watts 40   Minutes 10   Arm Ergometer   Level 1   Watts 10   Minutes 10   Recumbant Elliptical   Level 2   RPM 40   Watts 20   Minutes 10   REL-XR   Level 2   Watts 40   Minutes 10   Prescription Details   Frequency (times per week) 3   Duration Progress to 30 minutes of continuous aerobic without signs/symptoms of physical distress   Intensity   THRR REST +  30   Ratings of Perceived Exertion 11-15   Perceived Dyspnea 2-4   Progression Continue progressive overload as per policy without signs/symptoms or physical distress.   Resistance Training   Training Prescription Yes   Weight 2   Reps 10-15      Exercise Prescription Changes:     Exercise Prescription Changes      05/15/15 1500 05/17/15 1000 05/29/15 1639       Exercise Review   Progression   Yes     Response to Exercise   Blood Pressure (Admit) 150/80 mmHg  140/80 mmHg     Blood Pressure (Exercise) 180/88 mmHg  142/84 mmHg     Blood Pressure (Exit) 130/86 mmHg  140/82 mmHg     Heart Rate (Admit) 79 bpm  90 bpm     Heart Rate (Exercise) 92 bpm  100 bpm     Heart Rate (Exit) 85 bpm  84 bpm     Oxygen Saturation (Admit) 93 %  2l/m intermittent  91 %     Oxygen Saturation (Exercise) 93 %  2l/m Continuous  92 %     Oxygen Saturation (Exit) 95 %  95 %  Rating of Perceived Exertion (Exercise) 15  12     Perceived Dyspnea (Exercise) 4  3     Symptoms  None None     Comments   Reviewed gym orientation and the equipment functions and settings. Procedures and policies of the gym were outlined and explained. The patient's individual exercise prescription and treatment plan were reviewed with them. All starting workloads were established based on the results of the functional testing  done at the initial intake visit. The plan for exercise progression was also introduced and progression will be customized based on the patient's performance and goals.       Duration  Progress to 30 minutes of continuous aerobic without signs/symptoms of physical distress Progress to 30 minutes of continuous aerobic without signs/symptoms of physical distress     Intensity  Rest + 30 Rest + 30     Progression  Continue progressive overload as per policy without signs/symptoms or physical distress. Continue progressive overload as per policy without signs/symptoms or physical distress.     Resistance Training   Training Prescription Yes Yes Yes     Weight _0 Reps 10-12 10-12 10-12     Interval Training   Interval Training No No No     Recumbant Bike   Level 1  BioStep 1  BioStep 1  BioStep     Watts _1 Minutes _2 NuStep   Level _3 Watts 60 60 60     Minutes _4 Arm Ergometer   Level _5 Watts _6 Minutes _7 Discharge Exercise Prescription (Final Exercise Prescription Changes):     Exercise Prescription Changes - 05/29/15 1639    Exercise Review   Progression Yes   Response to Exercise   Blood Pressure (Admit) 140/80 mmHg   Blood Pressure (Exercise) 142/84 mmHg   Blood Pressure (Exit) 140/82 mmHg   Heart Rate (Admit) 90 bpm   Heart Rate (Exercise) 100 bpm   Heart Rate (Exit) 84 bpm   Oxygen Saturation (Admit) 91 %   Oxygen Saturation (Exercise) 92 %   Oxygen Saturation (Exit) 95 %   Rating of Perceived Exertion (Exercise) 12   Perceived Dyspnea (Exercise) 3   Symptoms None   Duration Progress to 30  minutes of continuous aerobic without signs/symptoms of physical distress   Intensity Rest + 30   Progression Continue progressive overload as per policy without signs/symptoms or physical distress.   Resistance Training   Training Prescription Yes   Weight 2   Reps 10-12   Interval Training   Interval Training No   Recumbant Bike   Level 1  BioStep   Watts 20   Minutes 8   NuStep   Level 1   Watts 60   Minutes 10   Arm Ergometer   Level 1   Watts 15   Minutes 10       Nutrition:  Target Goals: Understanding of nutrition guidelines, daily intake of sodium <1556m, cholesterol <2016m calories 30% from fat and 7% or less from saturated fats, daily to have 5 or more servings of fruits and vegetables.  Biometrics:     Pre Biometrics - 05/09/15 1422    Pre Biometrics   Height 5'  10" (1.778 m)   Weight 277 lb (125.646 kg)   Waist Circumference 50.5 inches   Hip Circumference 50.5 inches   Waist to Hip Ratio 1 %   BMI (Calculated) 39.8       Nutrition Therapy Plan and Nutrition Goals:     Nutrition Therapy & Goals - 05/09/15 0900    Nutrition Therapy   Diet Plans to meet with dietitian; likes fried foods and snacks all day, especially since he stopped smoking; needs encouragement to drink more fluids - only 3 glasses of water/day, some coffee      Nutrition Discharge: Rate Your Plate Scores:   Psychosocial: Target Goals: Acknowledge presence or absence of depression, maximize coping skills, provide positive support system. Participant is able to verbalize types and ability to use techniques and skills needed for reducing stress and depression.  Initial Review & Psychosocial Screening:     Initial Psych Review & Screening - 05/09/15 0900    Initial Review   Current issues with History of Depression   Family Dynamics   Good Support System? Yes   Comments Mr Douville has a history of depression and has counciled with therapists. He has good support from his  daughter and mother and has his [place] faith. He has a history of past drug use and chronic pain.   Barriers   Psychosocial barriers to participate in program The patient should benefit from training in stress management and relaxation.   Screening Interventions   Interventions Encouraged to exercise;Program counselor consult      Quality of Life Scores:     Quality of Life - 05/09/15 0900    Quality of Life Scores   Health/Function Pre 5.56 %   Socioeconomic Pre 13.71 %   Psych/Spiritual Pre 6.29 %   Family Pre 22.5 %   GLOBAL Pre 9.38 %      PHQ-9:     Recent Review Flowsheet Data    Depression screen [facility] 2/9 05/09/2015   Decreased Interest 3   Down, Depressed, Hopeless 3   PHQ - 2 Score 6   Altered sleeping 3   Tired, decreased energy 3   Change in appetite 3   Feeling bad or failure about yourself  3   Trouble concentrating 1   Moving slowly or fidgety/restless 1   Suicidal thoughts 0   PHQ-9 Score 20   Difficult doing work/chores Very difficult      Psychosocial Evaluation and Intervention:     Psychosocial Evaluation - 06/07/15 1015    Psychosocial Evaluation & Interventions   Comments Counselor met with Mr. Sweitzer today for initial psychosocial evaluation.  He is a 60 yr old gentleman who suffers from COPD and many other health issues, including Fibromyalgia, arthritis and chronic pain as a result.  Mr. Diebel has a daughter who is a strong support for him.  He is also actively involved in his local faith community.  Mr. Mcgann reports not sleeping well due to sleep apnea, although he has a CPAP, but reports his chronic pain prevents restful sleep.  He also states he  has a history of depression and anxiety but no current symptoms.  However, his PHQ-9 scores indicate with a score of 20, that depression is a current issue.  Mr. Campione stated he had been on medication for his mood several years ago, but has not been treated since that time.  His  current stressors are his health, not sleeping, and his chronic pain.  Counselor recommended he  speak with his doctor about possibly addressing his mood issues with medication and for Mr. Aguila to also consider seeing a counselor again to provide support during this difficult time.  He agreed to do so.  One of his goals is to lose weight, so he is encouraged to exercise consistently and meet with the dietician to help address this goal.  Counselor will folllow up with Mr. Zingaro on these recommendations.        Psychosocial Re-Evaluation:  Education: Education Goals: Education classes will be provided on a weekly basis, covering required topics. Participant will state understanding/return demonstration of topics presented.  Learning Barriers/Preferences:     Learning Barriers/Preferences - 05/09/15 0900    Learning Barriers/Preferences   Learning Barriers None   Learning Preferences Group Instruction;Individual Instruction;Pictoral;Skilled Demonstration;Verbal Instruction;Video;Written Material      Education Topics: Initial Evaluation Education: - Verbal, written and demonstration of respiratory meds, RPE/PD scales, oximetry and breathing techniques. Instruction on use of nebulizers and MDIs: cleaning and proper use, rinsing mouth with steroid doses and importance of monitoring MDI activations.          Pulmonary Rehab from 06/09/2015 in [place]   Date  05/09/15   Educator  LB   Instruction Review Code  2- meets goals/outcomes      General Nutrition Guidelines/Fats and Fiber: -Group instruction provided by verbal, written material, models and posters to present the general guidelines for heart healthy nutrition. Gives an explanation and review of dietary fats and fiber.   Controlling Sodium/Reading Food Labels: -Group verbal and written material supporting the discussion of sodium use in heart healthy nutrition. Review and  explanation with models, verbal and written materials for utilization of the food label.   Exercise Physiology & Risk Factors: - Group verbal and written instruction with models to review the exercise physiology of the cardiovascular system and associated critical values. Details cardiovascular disease risk factors and the goals associated with each risk factor.   Aerobic Exercise & Resistance Training: - Gives group verbal and written discussion on the health impact of inactivity. On the components of aerobic and resistive training programs and the benefits of this training and how to safely progress through these programs.   Flexibility, Balance, General Exercise Guidelines: - Provides group verbal and written instruction on the benefits of flexibility and balance training programs. Provides general exercise guidelines with specific guidelines to those with heart or lung disease. Demonstration and skill practice provided.   Stress Management: - Provides group verbal and written instruction about the health risks of elevated stress, cause of high stress, and healthy ways to reduce stress.   Depression: - Provides group verbal and written instruction on the correlation between heart/lung disease and depressed mood, treatment options, and the stigmas associated with seeking treatment.   Exercise & Equipment Safety: - Individual verbal instruction and demonstration of equipment use and safety with use of the equipment.      Pulmonary Rehab from 06/09/2015 in [place]   Date  05/15/15   Educator  LB   Instruction Review Code  2- meets goals/outcomes      Infection Prevention: - Provides verbal and written material to individual with discussion of infection control including proper hand washing and proper equipment cleaning during exercise session.      Pulmonary Rehab from 06/09/2015 in [place]   Date   05/15/15   Educator  LB   Instruction Review  Code  2- meets goals/outcomes      Falls Prevention: - Provides verbal and written material to individual with discussion of falls prevention and safety.      Pulmonary Rehab from 06/09/2015 in [place]   Date  05/09/15   Educator  LB   Instruction Review Code  2- meets goals/outcomes      Diabetes: - Individual verbal and written instruction to review signs/symptoms of diabetes, desired ranges of glucose level fasting, after meals and with exercise. Advice that pre and post exercise glucose checks will be done for 3 sessions at entry of program.   Chronic Lung Diseases: - Group verbal and written instruction to review new updates, new respiratory medications, new advancements in procedures and treatments. Provide informative websites and "800" numbers of self-education.      Pulmonary Rehab from 06/09/2015 in [place]   Date  06/09/15 [facility]]   Educator  CE   Instruction Review Code  2- meets goals/outcomes      Lung Procedures: - Group verbal and written instruction to describe testing methods done to diagnose lung disease. Review the outcome of test results. Describe the treatment choices: Pulmonary Function Tests, ABGs and oximetry.   Energy Conservation: - Provide group verbal and written instruction for methods to conserve energy, plan and organize activities. Instruct on pacing techniques, use of adaptive equipment and posture/positioning to relieve shortness of breath.      Pulmonary Rehab from 06/09/2015 in [place]   Date  05/17/15   Educator  SW   Instruction Review Code  2- meets goals/outcomes      Triggers: - Group verbal and written instruction to review types of environmental controls: home humidity, furnaces, filters, dust mite/pet prevention, HEPA vacuums. To discuss weather changes,  air quality and the benefits of nasal washing.   Exacerbations: - Group verbal and written instruction to provide: warning signs, infection symptoms, calling MD promptly, preventive modes, and value of vaccinations. Review: effective airway clearance, coughing and/or vibration techniques. Create an Sports administrator.   Oxygen: - Individual and group verbal and written instruction on oxygen therapy. Includes supplement oxygen, available portable oxygen systems, continuous and intermittent flow rates, oxygen safety, concentrators, and Medicare reimbursement for oxygen.      Pulmonary Rehab from 06/09/2015 in [place]   Date  05/09/15   Educator  LB   Instruction Review Code  2- meets goals/outcomes      Respiratory Medications: - Group verbal and written instruction to review medications for lung disease. Drug class, frequency, complications, importance of spacers, rinsing mouth after steroid MDI's, and proper cleaning methods for nebulizers.      Pulmonary Rehab from 06/09/2015 in [place]   Date  05/09/15   Educator  LB   Instruction Review Code  2- meets goals/outcomes      AED/CPR: - Group verbal and written instruction with the use of models to demonstrate the basic use of the AED with the basic ABC's of resuscitation.   Breathing Retraining: - Provides individuals verbal and written instruction on purpose, frequency, and proper technique of diaphragmatic breathing and pursed-lipped breathing. Applies individual practice skills.      Pulmonary Rehab from 06/09/2015 in [place]   Date  05/09/15   Educator  LB   Instruction Review Code  2- meets goals/outcomes  Anatomy and Physiology of the Lungs: - Group verbal and written instruction with the use of models to provide basic lung anatomy and physiology related to function, structure and complications of lung  disease.   Heart Failure: - Group verbal and written instruction on the basics of heart failure: signs/symptoms, treatments, explanation of ejection fraction, enlarged heart and cardiomyopathy.   Sleep Apnea: - Individual verbal and written instruction to review Obstructive Sleep Apnea. Review of risk factors, methods for diagnosing and types of masks and machines for OSA.      Pulmonary Rehab from 06/09/2015 in [facility]   Date  05/09/15   Educator  LB   Instruction Review Code  2- meets goals/outcomes      Anxiety: - Provides group, verbal and written instruction on the correlation between heart/lung disease and anxiety, treatment options, and management of anxiety.   Relaxation: - Provides group, verbal and written instruction about the benefits of relaxation for patients with heart/lung disease. Also provides patients with examples of relaxation techniques.   Knowledge Questionnaire Score:     Knowledge Questionnaire Score - 05/09/15 0900    Knowledge Questionnaire Score   Pre Score -3      Personal Goals and Risk Factors at Admission:     Personal Goals and Risk Factors at Admission - 05/09/15 0900    Personal Goals and Risk Factors on Admission    Weight Management Yes   Intervention Learn and follow the exercise and diet guidelines while in the program. Utilize the nutrition and education classes to help gain knowledge of the diet and exercise expectations in the program  Plans to meet with dietitian; likes fried foods and snacks all day, especially since he stopped smoking; needs encouragement to drink more fluids - only 3 glasses of water/day, some coffee   Admit Weight 277 lb (125.646 kg)   Goal Weight 200 lb (90.719 kg)   Increase Aerobic Exercise and Physical Activity Yes   Intervention While in program, learn and follow the exercise prescription taught. Start at a low level workload and increase workload after able to  maintain previous level for 30 minutes. Increase time before increasing intensity.  Mr Widen would like to be more active, especially daily activites. He has a [employer] and in the past, he exercised in the pool - walking and swimming, because less pain.   Understand more about Heart/Pulmonary Disease. Yes   Intervention --  Mr Favila has COPD and CHF and would like tolearn more about these diseases and management.    Improve shortness of breath with ADL's Yes   Intervention While in program, learn and follow the exercise prescription taught. Start at a low level workload and increase workload ad advised by the exercise physiologist. Increase time before increasing intensity.  Mr Flight has severe shortness of breath with activity . His SOB questionnaire was scored high at 78.   Develop more efficient breathing techniques such as purse lipped breathing and diaphragmatic breathing; and practicing self-pacing with activity Yes   Intervention While in program, learn and utilize the specific breathing techniques taught to you. Continue to practice and use the techniques as needed.  Mr Moody has been using PLB, but did not know it was a named technique. I encouraged him to use PLB with activity in class and at home.   Increase knowledge of respiratory medications and ability to use respiratory devices properly.  Yes   Intervention While in program learn and demonstrate  appropriate use of your oxygen therapy by increasing flow with exertion, manage oxygen tank operation, including continuous and intermittent flow.  Understanding oxygen is a drug ordered by your physician.;While in program, learn to administer MDI, nebulizer, and spacer properly.;Learn to take respiratory medicine as ordered.;While in program, learn to Clean MDI, nebulizers, and spacers properly.  Mr Lust takes Advair, Spiriva, and Proair. I educated him on a spacer, but will give him one when ordered. He wears 2l/m  Oxygen and uses a small gas portable tank from [employer]. He has CPAP, but cannot tolerate a full mask  - plans to change  mask.   Hypertension Yes   Goal Participant will see blood pressure controlled within the values of 140/51m/Hg or within value directed by their physician.   Intervention Provide nutrition & aerobic exercise along with prescribed medications to achieve BP 140/90 or less.      Personal Goals and Risk Factors Review:      Goals and Risk Factor Review      05/15/15 1000 05/17/15 1038 05/29/15 1000 05/29/15 1026 05/31/15 1000   Increase Aerobic Exercise and Physical Activity   Goals Progress/Improvement seen     Yes    Comments    JJishnuhas made significant increases in exercise very quickly.  Noticed both by patient as well as staff.    Take Less Medication   Goals Progress/Improvement seen  No      Comments  Oxygen has been increased to 3 L/m during exertion, 2 L/m at rest      Understand more about Heart/Pulmonary Disease   Goals Progress/Improvement seen    Yes     Comments   Discussed with Mr DClinkscaleabout his COPD. He wants to learn more about this condition. His physician did mention a diagnosis of asthma. He did have asthma as a child and out grew it, but he needs more understanding of COPD.     Improve shortness of breath with ADL's   Goals Progress/Improvement seen    Yes     Comments   Mr DBlydenhas shortness of breath with activity and does state that his pain causes shortness of breath as well. His PD Scales are acceptable at "3".     Breathing Techniques   Goals Progress/Improvement seen  Yes   Yes    Comments Mr DDansereauwas using PLB with his exercise goals - it helps with his shortness of breath and pain.   working on PLB with exercise    Increase knowledge of respiratory medications   Goals Progress/Improvement seen    Yes     Comments   Mr DBernardshas been using 2l/m with his exercise goals and has maintained acceptable O2Sat's, but he is able  to increase his oxygen to 3l/m as needed.      Hypertension   Goal    Participant will see blood pressure controlled within the values of 140/9105mHg or within value directed by their physician. --  Mr DiDelislerrived to class today with increased fluid retention and high BP of  190/98. After sitting for several minutes, the BP did come down to 158/96. Mr DiGottwalds physician was called and education was done on salt intake.       Personal Goals Discharge (Final Personal Goals and Risk Factors Review):      Goals and Risk Factor Review - 05/31/15 1000    Hypertension   Goal --  Mr DiPrimerrived to class today with increased fluid  retention and high BP of  190/98. After sitting for several minutes, the BP did come down to 158/96. Mr Mcgranahan 's physician was called and education was done on salt intake.       ITP Comments:     ITP Comments      05/31/15 1139 06/27/15 0655 07/24/15 0847       ITP Comments Patient presented today with more fluid buildup Did not feel like exercising.  Discussed possible causes of fluid retention, and then the changes to make so it would not continue to happen.  Patient verbalized understanding, and will return Friday. Mr Yehle last attended 06/09/2015. He has had medical issues with fluid retention and increased blood pressure. He plans to return to [place] as soon as possible. Mr Benninger was contacted 07/12/2015. He has been sick and has car troubles. He last attended 06/09/2015.        Comments:  30 day note review

## 2015-08-01 ENCOUNTER — Ambulatory Visit (INDEPENDENT_AMBULATORY_CARE_PROVIDER_SITE_OTHER): Payer: Medicaid Other | Admitting: Nurse Practitioner

## 2015-08-01 ENCOUNTER — Encounter: Payer: Self-pay | Admitting: Nurse Practitioner

## 2015-08-01 VITALS — BP 132/84 | HR 92 | Ht 70.0 in | Wt 282.8 lb

## 2015-08-01 DIAGNOSIS — I5032 Chronic diastolic (congestive) heart failure: Secondary | ICD-10-CM

## 2015-08-01 DIAGNOSIS — I110 Hypertensive heart disease with heart failure: Secondary | ICD-10-CM

## 2015-08-01 DIAGNOSIS — I119 Hypertensive heart disease without heart failure: Secondary | ICD-10-CM | POA: Insufficient documentation

## 2015-08-01 NOTE — Patient Instructions (Signed)
Medication Instructions:  Your physician recommends that you continue on your current medications as directed. Please refer to the Current Medication list given to you today.   Labwork: none  Testing/Procedures: none  Follow-Up: Your physician recommends that you schedule a follow-up appointment in: 3 months with Dr. Rockey Situ   Any Other Special Instructions Will Be Listed Below (If Applicable).     If you need a refill on your cardiac medications before your next appointment, please call your pharmacy.

## 2015-08-01 NOTE — Progress Notes (Signed)
Office Visit    Patient Name: KASRA HELLBERG Date of Encounter: 08/01/2015  Primary Care Provider:  Marguerita Merles, MD Primary Cardiologist:  Johnny Bridge, MD   Chief Complaint    60 year old male with a history of diastolic heart failure, hypertension, and COPD, who presents for follow-up.  Past Medical History    Past Medical History  Diagnosis Date  . Hypertensive heart disease   . Chronic diastolic CHF (congestive heart failure) ([place])     a. 04/2015 Echo: EF 55-60%, no rwma, mildly dil LA/RV/RA, PASP 79mmHg.  Marland Kitchen COPD (chronic obstructive pulmonary disease) ([place])     a. Uses O2 @ 3lpm via [place] around the clock.  . Depression   . GERD (gastroesophageal reflux disease)   . Arthritis   . Fibromyalgia   . Morbid obesity ([place])   . Osteoarthritis     a. Back, neck, and bilateral knee pain->severely limits activity.   Past Surgical History  Procedure Laterality Date  . Cholecystectomy    . Knee arthroscopy Left   . Vasectomy    . Hernia repair    . Shoulder surgery Right   . Irrigation and debridement sebaceous cyst N/A 12/07/2014    Procedure: IRRIGATION AND DEBRIDEMENT SEBACEOUS CYST;  Surgeon: Dia Crawford III, MD;  Location: [facility] ORS;  Service: General;  Laterality: N/A;    Allergies  No Known Allergies  History of Present Illness    60 year old morbidly obese male with the above complex past medical history. He is a long history of tobacco abuse though he is now quit. He is followed by pulmonology related COPD and uses supplemental oxygen via nasal cannula at 3 L/m daily. Activity is severely limited in the setting of significant osteoarthritis affecting both of his knees, his back, and his neck. As result, he is quite sedentary and over the past few years has gained approximately 100 pounds. Some of that has been fluid weight and in October, echocardiogram showed normal LV function. He was placed on Lasix and subsequently taken off of HCTZ. He is currently taking Lasix 40  mg 2 tablets daily and says that this has been doing a pretty good job at keeping his lower extremity edema to a minimum. He is concerned that much of his weight gain is related to high caloric intake in the setting of inactivity. He has tried different diet types including an Atkins like high-protein diet as well as trying to eat 5 small meals a day. It appears that he is instead eating 5 regular sized meals per day. We had a long discussion today about weight loss.   He has chronic dyspnea on exertion. He does not weigh himself at home because he does not own a scale. As above, his lower extremity edema has been stable. He denies chest pain, PND, orthopnea, dizziness, syncope, or early satiety. He sleeps in his recliner at night not secondary to orthopnea but because of back and neck pain.   Home Medications    Prior to Admission medications   Medication Sig Start Date End Date Taking? Authorizing Provider  acetaminophen (TYLENOL) 650 MG CR tablet Take 1,300 mg by mouth every 8 (eight) hours as needed for pain.   Yes Historical Provider, MD  albuterol (PROVENTIL HFA;VENTOLIN HFA) 108 (90 BASE) MCG/ACT inhaler Inhale 1-2 puffs into the lungs every 4 (four) hours as needed for wheezing or shortness of breath. 02/08/15  Yes Vishal Mungal, MD  esomeprazole (NEXIUM) 40 MG capsule Take 40 mg by  mouth 2 (two) times daily before a meal.   Yes Historical Provider, MD  Fenofibrate (TRICOR PO) Take 1 tablet by mouth daily. Patient unsure of dosage.   Yes Historical Provider, MD  Fluticasone-Salmeterol (ADVAIR) 500-50 MCG/DOSE AEPB Inhale 1 puff into the lungs 2 (two) times daily. 02/08/15  Yes Vishal Mungal, MD  furosemide (LASIX) 40 MG tablet Take 80 mg by mouth daily.   Yes Historical Provider, MD  lisinopril (PRINIVIL,ZESTRIL) 40 MG tablet Take 40 mg by mouth daily.   Yes Historical Provider, MD  potassium chloride (K-DUR) 10 MEQ tablet Take 1 tablet (10 mEq total) by mouth daily. 06/21/15  Yes Minna Merritts, MD  pregabalin (LYRICA) 50 MG capsule Take 150 mg by mouth 2 (two) times daily.    Yes Historical Provider, MD  tiotropium (SPIRIVA) 18 MCG inhalation capsule Place 1 capsule (18 mcg total) into inhaler and inhale daily. 02/16/15  Yes Vilinda Boehringer, MD    Review of Systems    As above, his chronic dyspnea exertion and wear supplemental oxygen 24 hours a day. He sleeps in a recliner at night secondary to neck and back pain, which is chronic. He has severe bilateral knee pain which limits his activities. He does have mild lower extremity edema but this is overall improved. He has not been having chest pain, PND, orthopnea, dizziness, syncope, or early satiety.  All other systems reviewed and are otherwise negative except as noted above.  Physical Exam    VS:  BP 132/84 mmHg  Pulse 82  Ht 5\' 10"  (1.778 m)  Wt 282 lb 12.8 oz (128.277 kg)  BMI 40.58 kg/m2  SpO2 96% , BMI Body mass index is 40.58 kg/(m^2). [id number] in no acute distress. HEENT: normal. Neck: Supple, obese, difficult to gauge JVP, no  carotid bruits, or masses. Cardiac: RRR, no murmurs, rubs, or gallops. No clubbing, cyanosis,  trace bilateral ankle edema.  Radials/DP/PT 2+ and equal bilaterally.  Respiratory:  Respirations regular and unlabored,diminished breath sounds bilaterally with faint expiratory wheezing noted at the apices. GI: Obese, protuberant, semi-firm, BS + x 4. MS: no deformity or atrophy. Skin: warm and dry, no rash. Neuro:  Strength and sensation are intact. Psych: Normal affect.  Accessory Clinical Findings    Echocardiogram from October 2016 reviewed.  Assessment & Plan    1.  Chronic diastolic and just heart failure: Patient's weight is down about 5 pounds since his visit with pulmonology on January 19. He has trace lower extremity edema. He does not have any significant crackles on exam and his dyspnea on exertion appears to be at baseline. His heart rate and blood pressure are reasonably  controlled and he is taking Lasix 40 mg, 2 tablets daily. He does not weigh himself at home because he does not weigh scale. We have left a message for the heart failure clinic in an effort to obtain a scale for him. We had a long talk about diet and caloric intake. He appears to be doing a very good job limiting sodium in his diet and is now preparing his own meals and truly avoiding canned and boxed cuts. He is not eating out. He appears to have made a real commitment to this.  2. Evidence of heart disease: Blood pressure is stable on ACE inhibitor and diuretic therapy. No changes today.  3. Morbid obesity: The setting of significant inactivity and pain related to arthritis and fibromyalgia, patient is sedentary. This significantly limits his ability to burn calories. He  has tried a few different diet regimens but has only noted weight gain. He is trying to limit carbohydrates as much as possible but it sounds as though he is eating 5 relatively high caloric meals per day instead of 5 small meals per day. We discussed the importance of daily weights and how it simply wearing oneself increase his awareness and change his behavior. As above, we will try to obtain a scale for him as he does not think he can afford. We also discussed the role of calorie counting. This would be made easier by application of a smart phone however he does not own one. In that case, we discussed manually counting calories and trying to reduce his calories to somewhere around 2500 per day with a goal of 1 pound of weight loss per week. He was receptive to this discussion and we talked for about 20 minutes. Unfortunately, because of his significant lung disease he is not likely a reasonable candidate for more advanced therapies like lap band or gastric bypass.  4. Disposition: Follow-up with Dr. Rockey Situ in 3 months or sooner if necessary.   Murray Hodgkins, NP 08/01/2015, 12:54 PM

## 2015-08-02 ENCOUNTER — Encounter: Payer: Medicaid Other | Attending: Internal Medicine

## 2015-08-02 ENCOUNTER — Telehealth: Payer: Self-pay

## 2015-08-02 DIAGNOSIS — J449 Chronic obstructive pulmonary disease, unspecified: Secondary | ICD-10-CM | POA: Insufficient documentation

## 2015-08-02 NOTE — Telephone Encounter (Signed)
Per CHF clinic, they are out of scales. Have pt contact them. Left detailed message on pt VM w/clinic phone number to contact for scales p/u when in stock.

## 2015-08-08 ENCOUNTER — Encounter: Payer: Self-pay | Admitting: Internal Medicine

## 2015-08-08 ENCOUNTER — Ambulatory Visit (INDEPENDENT_AMBULATORY_CARE_PROVIDER_SITE_OTHER): Payer: Medicaid Other | Admitting: Internal Medicine

## 2015-08-08 VITALS — BP 134/64 | HR 87 | Ht 70.0 in | Wt 278.6 lb

## 2015-08-08 DIAGNOSIS — R0902 Hypoxemia: Secondary | ICD-10-CM | POA: Diagnosis not present

## 2015-08-08 DIAGNOSIS — G4733 Obstructive sleep apnea (adult) (pediatric): Secondary | ICD-10-CM | POA: Diagnosis not present

## 2015-08-08 DIAGNOSIS — J410 Simple chronic bronchitis: Secondary | ICD-10-CM | POA: Diagnosis not present

## 2015-08-08 NOTE — Patient Instructions (Addendum)
Follow up with Dr. Stevenson Clinch in: 3 months - we will schedule a one time sleep visit to see Dr. Juanell Fairly [facility]) to optimize your sleep apnea treatment plan.  Dr. Juanell Fairly visit in 6-8 weeks.  Please bring a print out of your sleep compliance report to this visit.  - Please wear his CPAP at least 4 hours per night, and increase by 1 hour each week, goal per night is 7-8 hours. -Continue which your inhalers -Continue with exercise, at [place] with healthy diet and weight loss -Continue to avoid any forms of tobacco or smoking

## 2015-08-08 NOTE — Progress Notes (Signed)
[facility] Consultation      MRN# [id number] Hunter White Dec 17, 1955   CC: Chief Complaint  Patient presents with  . Follow-up    pt. states breathing has improved. occ. SOB. occ. dry cough. denies wheezing or chest pain/tightness. states he can't wear CPAP due to not being able to breathe. 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  Former smoker , 40 pack year, quit in 01/2015. On Advair/spiriva, cpap, 2L O2 at night.   ROV 02/08/15 Patient presents for follow up visit. Quit smoking 2 weeks ago. Heat makes dyspnea worst and activity. Activity is difficult also due to hip and chronic back pain issues.  Currently on 2L of O2 at night, this causes some sinus drainage (clear).   Plan: -Advair 500/50 -Spiriva Respimat -Albuterol rescue inhaler -Referral to pulmonary rehabilitation -Continue to avoid tobacco -Diet, exercise, weight loss.   ROV 03/22/15: Presents today for a follow-up visit, since his last visit he's had a CPAP titration study. His titration study showed that he requires 15 cm H2O. States that his breathing has improved since he stopped smoking. States that he is on 2 fluid pills but still having lower ext. Swelling.  Overall with good improvement in his breathing status. Medicaid denied his pulm rehab. Currently attending the [facility], doing water exercises, 5/7 days.  Has had some weight gain.    ROV 05/2015 Patient presents today for a follow-up visit. Since his last visit he's had one acute care visit with [name],  In early October, presented with worsening shortness of breath and sputum production, and diagnosed with COPD exacerbation, was started on antibiotics and steroids.  patient states today that overall his breathing is doing fairly well, he is on 3 L of oxygen continuously, he also has a facemask eval with [facility] for CPAP use. Today  his major concerns of bilateral swelling of his lower legs and tingling in the bottom of his feet, he still has a chronic shortness of breath, and mild intermittent cough which is mildly productive at times.  He will see his DME tomorrow due to his facemask for his CPAP machine not fitting properly. Plan: -Advair 500/50 -Spiriva Respimat -Albuterol rescue inhaler -Continue to avoid tobacco -Diet, exercise, weight loss. -cont with cpap nightly - 2L o2 continuously   ROV 07/20/15 patient presents today for follow-up visit of his COPD. Since his last visit he had one outpatient visit with pulmonary clinic and was advised to continue with inhalers and continuous oxygen. At today's visit, he states over the past month he's had intermittent shortness of breath, requiring increase of his supplemental O2 from 2 L sometimes to 3 L, along with increased shortness of breath, along with sputum production greenish in nature.  he was given 2 DuoNeb treatments  During today's visit  patient states he was attending pulmonary rehabilitation in December 2016, was doing initially well, but started developing knee issues , for which he follows orthopedics. Due to his knee issues and increased shortness of breath he had to stop pulmonary rehabilitation early.  also, noted upon arrival his O2 saturations on 2 L of continuous oxygen was 84%, after increasing oxygen to 3 L, his O2 sat rose to 94%. Plan - cont with bronchodilators, prednisone, zpak, cpap.   Events since last clinic visit: Patient presents today for follow-up visit of his COPD, and sleep  apnea. Since his last visit he's had a Z-Pak followed by prednisone taper, his symptoms have essentially resolved. He has a chronic morning nonproductive cough, and baseline shortness of breath. Today he states he is back to his baseline breathing, he has restarted exercising and going to the [facility], and performing inward exercising. He has not restarted pulmonary  rehabilitation yet. He states that he feels anxiety when he wears a CPAP at times, and he has not been compliant with his CPAP machine.  Medication:   Current Outpatient Rx  Name  Route  Sig  Dispense  Refill  . acetaminophen (TYLENOL) 650 MG CR tablet   Oral   Take 1,300 mg by mouth every 8 (eight) hours as needed for pain.         Marland Kitchen albuterol (PROVENTIL HFA;VENTOLIN HFA) 108 (90 BASE) MCG/ACT inhaler   Inhalation   Inhale 1-2 puffs into the lungs every 4 (four) hours as needed for wheezing or shortness of breath.   1 Inhaler   6   . esomeprazole (NEXIUM) 40 MG capsule   Oral   Take 40 mg by mouth 2 (two) times daily before a meal.         . Fenofibrate (TRICOR PO)   Oral   Take 1 tablet by mouth daily. Patient unsure of dosage.         . Fluticasone-Salmeterol (ADVAIR) 500-50 MCG/DOSE AEPB   Inhalation   Inhale 1 puff into the lungs 2 (two) times daily.   60 each   4   . furosemide (LASIX) 40 MG tablet   Oral   Take 80 mg by mouth daily.         Marland Kitchen lisinopril (PRINIVIL,ZESTRIL) 40 MG tablet   Oral   Take 40 mg by mouth daily.         . potassium chloride (K-DUR) 10 MEQ tablet   Oral   Take 1 tablet (10 mEq total) by mouth daily.   90 tablet   3   . pregabalin (LYRICA) 50 MG capsule   Oral   Take 150 mg by mouth 2 (two) times daily.          Marland Kitchen tiotropium (SPIRIVA) 18 MCG inhalation capsule   Inhalation   Place 1 capsule (18 mcg total) into inhaler and inhale daily.   30 capsule   6      Review of Systems  Constitutional: Negative for fever and chills.  Eyes: Negative for blurred vision and double vision.  Respiratory: Positive for cough and shortness of breath. Negative for hemoptysis, sputum production and wheezing.        Chronic sob, but improving since stopped smoking  Cardiovascular: Negative for chest pain and palpitations.  Gastrointestinal: Negative for heartburn, nausea and vomiting.  Skin: Negative for rash.  Neurological: Negative  for dizziness and headaches.  Endo/Heme/Allergies: Negative for environmental allergies. Does not bruise/bleed easily.      Allergies:  Review of patient's allergies indicates no known allergies.  Physical Examination:  VS: BP 134/64 mmHg  Pulse 87  Ht 5\' 10"  (1.778 m)  Wt 278 lb 9.6 oz (126.372 kg)  BMI 39.97 kg/m2  SpO2 97%  General Appearance: No distress  HEENT: PERRLA, no ptosis, no other lesions noticed Pulmonary: Good respiratory effort, clear breath sounds to the bases, no wheezing, no stridor, no rales/rhonchi/crackles Cardiovascular:  Normal S1,S2.  No m/r/g.     Abdomen:Exam: Benign, Soft, non-tender, No masses  Skin:   warm, no rashes, no ecchymosis  Extremities: normal, no cyanosis, clubbing, warm with normal capillary refill.  2+ pitting edema Bilateral LE    Assessment and Plan: 60 year old male seen in follow-up visit for COPD and OSA COPD (chronic obstructive pulmonary disease) Pulmonary function testing with severe, very severe, obstruction, FEV1 29%, postBD FEV1 35% Patient has stopped smoking and has gained significant weight since then, counseled today on diet and exercise. Has lost 4 pounds since last visit  Continue with Advair, Spiriva, rescue inhaler Restart pulmonary rehabilitation Restart going to gym, [employer].  Patient with other comorbidities such as obesity, OSA, hypertension, tobacco abuse. Controlling all his comorbidities will assist in his overall quality of life.  Plan: -Advair 500/50 -Spiriva Respimat -Albuterol rescue inhaler -Continue to avoid tobacco -Diet, exercise, weight loss.            Hypoxia Cont with 3L O2 continuously, even with CPAP         OSA (obstructive sleep apnea) PSG: 01/17/2015 Titration Study: 02/17/15 -Severe obstructive sleep apnea, RDI 44.4 - CPAP 15cm H20 with 3L O2 at night and with naps.  - patient counseled on using cpap nightly, currently noncompliant, states that he feels he cannot  breathe when wearing CPAP. -Counsel patient on behavioral techniques for CPAP wearing, which include starting CPAP for 4 hours per night and increase by 1 hour per week for a goal of 6-8 hours per night, place CPAP on when about to fall asleep. -We'll schedule one time visit with sleep specialist for OSA optimization  OSA  Discussed sleep data and reviewed with patient.  Encouraged proper weight management.  Excessive weight may contribute to snoring.  Monitor sedative use.  Discussed driving precautions and its relationship with hypersomnolence.  Discussed operating dangerous equipment and its relationship with hypersomnolence.  Discussed sleep hygiene, and benefits of a fixed sleep waked time.  The importance of getting eight or more hours of sleep discussed with patient.  Discussed limiting the use of the computer and television before bedtime.  Decrease naps during the day, so night time sleep will become enhanced.  Limit caffeine, and sleep deprivation.  HTN, stroke, and heart failure are potential risk factors.                 Updated Medication List Outpatient Encounter Prescriptions as of 08/08/2015  Medication Sig  . acetaminophen (TYLENOL) 650 MG CR tablet Take 1,300 mg by mouth every 8 (eight) hours as needed for pain.  Marland Kitchen albuterol (PROVENTIL HFA;VENTOLIN HFA) 108 (90 BASE) MCG/ACT inhaler Inhale 1-2 puffs into the lungs every 4 (four) hours as needed for wheezing or shortness of breath.  . esomeprazole (NEXIUM) 40 MG capsule Take 40 mg by mouth 2 (two) times daily before a meal.  . Fenofibrate (TRICOR PO) Take 1 tablet by mouth daily. Patient unsure of dosage.  . Fluticasone-Salmeterol (ADVAIR) 500-50 MCG/DOSE AEPB Inhale 1 puff into the lungs 2 (two) times daily.  . furosemide (LASIX) 40 MG tablet Take 80 mg by mouth daily.  Marland Kitchen lisinopril (PRINIVIL,ZESTRIL) 40 MG tablet Take 40 mg by mouth daily.  . potassium chloride (K-DUR) 10 MEQ tablet Take 1 tablet (10 mEq  total) by mouth daily.  . pregabalin (LYRICA) 50 MG capsule Take 150 mg by mouth 2 (two) times daily.   Marland Kitchen tiotropium (SPIRIVA) 18 MCG inhalation capsule Place 1 capsule (18 mcg total) into inhaler and inhale daily.   No facility-administered encounter medications on file as of 08/08/2015.    Orders for this visit: No orders of the  defined types were placed in this encounter.    Thank  you for the visitation and for allowing  [facility] to assist in the care of your patient. Our recommendations are noted above.  Please contact us if we can be of further service.  Vilinda Boehringer, MD [facility] Office Number: [phone]

## 2015-08-08 NOTE — Assessment & Plan Note (Signed)
Pulmonary function testing with severe, very severe, obstruction, FEV1 29%, postBD FEV1 35% Patient has stopped smoking and has gained significant weight since then, counseled today on diet and exercise. Has lost 4 pounds since last visit  Continue with Advair, Spiriva, rescue inhaler Restart pulmonary rehabilitation Restart going to gym, [employer].  Patient with other comorbidities such as obesity, OSA, hypertension, tobacco abuse. Controlling all his comorbidities will assist in his overall quality of life.  Plan: -Advair 500/50 -Spiriva Respimat -Albuterol rescue inhaler -Continue to avoid tobacco -Diet, exercise, weight loss.

## 2015-08-08 NOTE — Assessment & Plan Note (Signed)
PSG: 01/17/2015 Titration Study: 02/17/15 -Severe obstructive sleep apnea, RDI 44.4 - CPAP 15cm H20 with 3L O2 at night and with naps.  - patient counseled on using cpap nightly, currently noncompliant, states that he feels he cannot breathe when wearing CPAP. -Counsel patient on behavioral techniques for CPAP wearing, which include starting CPAP for 4 hours per night and increase by 1 hour per week for a goal of 6-8 hours per night, place CPAP on when about to fall asleep. -We'll schedule one time visit with sleep specialist for OSA optimization  OSA  Discussed sleep data and reviewed with patient.  Encouraged proper weight management.  Excessive weight may contribute to snoring.  Monitor sedative use.  Discussed driving precautions and its relationship with hypersomnolence.  Discussed operating dangerous equipment and its relationship with hypersomnolence.  Discussed sleep hygiene, and benefits of a fixed sleep waked time.  The importance of getting eight or more hours of sleep discussed with patient.  Discussed limiting the use of the computer and television before bedtime.  Decrease naps during the day, so night time sleep will become enhanced.  Limit caffeine, and sleep deprivation.  HTN, stroke, and heart failure are potential risk factors.

## 2015-08-08 NOTE — Assessment & Plan Note (Signed)
Cont with 3L O2 continuously, even with CPAP

## 2015-08-21 ENCOUNTER — Encounter: Payer: Self-pay | Admitting: Respiratory Therapy

## 2015-08-21 DIAGNOSIS — J449 Chronic obstructive pulmonary disease, unspecified: Secondary | ICD-10-CM

## 2015-08-21 NOTE — Progress Notes (Signed)
Pulmonary Individual Treatment Plan  Patient Details  Name: Hunter White MRN: [id number] Date of Birth: 1955-10-22 Referring Provider:  Dr Vilinda Boehringer  Initial Encounter Date: 05/09/2015  Visit Diagnosis: COPD, severe ([facility])  Patient's Home Medications on Admission:  Current outpatient prescriptions:    acetaminophen (TYLENOL) 650 MG CR tablet, Take 1,300 mg by mouth every 8 (eight) hours as needed for pain., Disp: , Rfl:    albuterol (PROVENTIL HFA;VENTOLIN HFA) 108 (90 BASE) MCG/ACT inhaler, Inhale 1-2 puffs into the lungs every 4 (four) hours as needed for wheezing or shortness of breath., Disp: 1 Inhaler, Rfl: 6   esomeprazole (NEXIUM) 40 MG capsule, Take 40 mg by mouth 2 (two) times daily before a meal., Disp: , Rfl:    Fenofibrate (TRICOR PO), Take 1 tablet by mouth daily. Patient unsure of dosage., Disp: , Rfl:    Fluticasone-Salmeterol (ADVAIR) 500-50 MCG/DOSE AEPB, Inhale 1 puff into the lungs 2 (two) times daily., Disp: 60 each, Rfl: 4   furosemide (LASIX) 40 MG tablet, Take 80 mg by mouth daily., Disp: , Rfl:    lisinopril (PRINIVIL,ZESTRIL) 40 MG tablet, Take 40 mg by mouth daily., Disp: , Rfl:    potassium chloride (K-DUR) 10 MEQ tablet, Take 1 tablet (10 mEq total) by mouth daily., Disp: 90 tablet, Rfl: 3   pregabalin (LYRICA) 50 MG capsule, Take 150 mg by mouth 2 (two) times daily. , Disp: , Rfl:    tiotropium (SPIRIVA) 18 MCG inhalation capsule, Place 1 capsule (18 mcg total) into inhaler and inhale daily., Disp: 30 capsule, Rfl: 6  Past Medical History: Past Medical History  Diagnosis Date   Hypertensive heart disease    Chronic diastolic CHF (congestive heart failure) ([place])     a. 04/2015 Echo: EF 55-60%, no rwma, mildly dil LA/RV/RA, PASP 4mHg.   COPD (chronic obstructive pulmonary disease) ([place]     a. Uses O2 @ 3lpm via [place] around the clock.   Depression    GERD (gastroesophageal reflux disease)    Arthritis    Fibromyalgia    Morbid  obesity ([place]    Osteoarthritis     a. Back, neck, and bilateral knee pain->severely limits activity.    Tobacco Use: History  Smoking status   Former Smoker -- 0.25 packs/day for 35 years   Types: Cigarettes   Quit date: 01/26/2015  Smokeless tobacco   Former [employer] developer  Quit date: 11/22/1973    Labs: Recent Review Flowsheet Data    There is no flowsheet data to display.       ADL UCSD:     ADL UCSD      05/09/15 0900       ADL UCSD   ADL Phase Entry     SOB Score total 78     Rest 1     Walk 5     Stairs 5     Bath 5     Dress 5     Shop 5         Pulmonary Function Assessment:     Pulmonary Function Assessment - 05/09/15 0900    Pulmonary Function Tests   RV% 220 %   DLCO% 54 %   Initial Spirometry Results   FVC% 50 %   FEV1% 29 %   FEV1/FVC Ratio 45   Post Bronchodilator Spirometry Results   FVC% 58 %   FEV1% 35 %   FEV1/FVC Ratio 45   Breath   Bilateral Breath Sounds Clear;Decreased  Shortness of Breath Yes;Fear of Shortness of Breath;Limiting activity      Exercise Target Goals:    Exercise Program Goal: Individual exercise prescription set with THRR, safety & activity barriers. Participant demonstrates ability to understand and report RPE using BORG scale, to self-measure pulse accurately, and to acknowledge the importance of the exercise prescription.  Exercise Prescription Goal: Starting with aerobic activity 30 plus minutes a day, 3 days per week for initial exercise prescription. Provide home exercise prescription and guidelines that participant acknowledges understanding prior to discharge.  Activity Barriers & Risk Stratification:     Activity Barriers & Cardiac Risk Stratification - 05/09/15 0900    Activity Barriers & Cardiac Risk Stratification   Activity Barriers Joint Problems;Fibromyalgia;Deconditioning;Muscular Weakness;Shortness of Breath   Cardiac Risk Stratification High      6 Minute Walk:     6 Minute  Walk      05/09/15 1415       6 Minute Walk   Phase Initial     Distance 400 feet     Walk Time 4 minutes     RPE 18     Perceived Dyspnea  8     Symptoms Yes (comment)     Comments SOB, fatigue     Resting HR 91 bpm     Resting BP 146/76 mmHg     Max Ex. HR 118 bpm     Max Ex. BP 182/80 mmHg        Initial Exercise Prescription:     Initial Exercise Prescription - 05/09/15 1400    Date of Initial Exercise Prescription   Date 05/09/15   Treadmill   MPH 2   Grade 0   Minutes 10   Recumbant Bike   Level 2   RPM 40   Watts 20   Minutes 10   NuStep   Level 2   Watts 40   Minutes 10   Arm Ergometer   Level 1   Watts 10   Minutes 10   Recumbant Elliptical   Level 2   RPM 40   Watts 20   Minutes 10   REL-XR   Level 2   Watts 40   Minutes 10   Prescription Details   Frequency (times per week) 3   Duration Progress to 30 minutes of continuous aerobic without signs/symptoms of physical distress   Intensity   THRR REST +  30   Ratings of Perceived Exertion 11-15   Perceived Dyspnea 2-4   Progression Continue progressive overload as per policy without signs/symptoms or physical distress.   Resistance Training   Training Prescription Yes   Weight 2   Reps 10-15      Exercise Prescription Changes:     Exercise Prescription Changes      05/15/15 1500 05/17/15 1000 05/29/15 1639       Exercise Review   Progression   Yes     Response to Exercise   Blood Pressure (Admit) 150/80 mmHg  140/80 mmHg     Blood Pressure (Exercise) 180/88 mmHg  142/84 mmHg     Blood Pressure (Exit) 130/86 mmHg  140/82 mmHg     Heart Rate (Admit) 79 bpm  90 bpm     Heart Rate (Exercise) 92 bpm  100 bpm     Heart Rate (Exit) 85 bpm  84 bpm     Oxygen Saturation (Admit) 93 %  2l/m intermittent  91 %     Oxygen Saturation (Exercise) 93 %  2l/m Continuous  92 %     Oxygen Saturation (Exit) 95 %  95 %     Rating of Perceived Exertion (Exercise) 15  12     Perceived Dyspnea  (Exercise) 4  3     Symptoms  None None     Comments  Reviewed gym orientation and the equipment functions and settings. Procedures and policies of the gym were outlined and explained. The patient's individual exercise prescription and treatment plan were reviewed with them. All starting workloads were established based on the results of the functional testing  done at the initial intake visit. The plan for exercise progression was also introduced and progression will be customized based on the patient's performance and goals.       Duration  Progress to 30 minutes of continuous aerobic without signs/symptoms of physical distress Progress to 30 minutes of continuous aerobic without signs/symptoms of physical distress     Intensity  Rest + 30 Rest + 30     Progression  Continue progressive overload as per policy without signs/symptoms or physical distress. Continue progressive overload as per policy without signs/symptoms or physical distress.     Resistance Training   Training Prescription Yes Yes Yes     Weight '2 2 2     '$ Reps 10-12 10-12 10-12     Interval Training   Interval Training No No No     Recumbant Bike   Level 1  BioStep 1  BioStep 1  BioStep     Watts '20 20 20     '$ Minutes '8 8 8     '$ NuStep   Level '1 1 1     '$ Watts 60 60 60     Minutes '10 10 10     '$ Arm Ergometer   Level '1 1 1     '$ Watts '15 15 15     '$ Minutes '10 10 10        '$ Discharge Exercise Prescription (Final Exercise Prescription Changes):     Exercise Prescription Changes - 05/29/15 1639    Exercise Review   Progression Yes   Response to Exercise   Blood Pressure (Admit) 140/80 mmHg   Blood Pressure (Exercise) 142/84 mmHg   Blood Pressure (Exit) 140/82 mmHg   Heart Rate (Admit) 90 bpm   Heart Rate (Exercise) 100 bpm   Heart Rate (Exit) 84 bpm   Oxygen Saturation (Admit) 91 %   Oxygen Saturation (Exercise) 92 %   Oxygen Saturation (Exit) 95 %   Rating of Perceived Exertion (Exercise) 12   Perceived Dyspnea  (Exercise) 3   Symptoms None   Duration Progress to 30 minutes of continuous aerobic without signs/symptoms of physical distress   Intensity Rest + 30   Progression Continue progressive overload as per policy without signs/symptoms or physical distress.   Resistance Training   Training Prescription Yes   Weight 2   Reps 10-12   Interval Training   Interval Training No   Recumbant Bike   Level 1  BioStep   Watts 20   Minutes 8   NuStep   Level 1   Watts 60   Minutes 10   Arm Ergometer   Level 1   Watts 15   Minutes 10       Nutrition:  Target Goals: Understanding of nutrition guidelines, daily intake of sodium '1500mg'$ , cholesterol '200mg'$ , calories 30% from fat and 7% or less from saturated fats, daily to have 5 or more servings of fruits and  vegetables.  Biometrics:     Pre Biometrics - 05/09/15 1422    Pre Biometrics   Height '5\' 10"'$  (1.778 m)   Weight 277 lb (125.646 kg)   Waist Circumference 50.5 inches   Hip Circumference 50.5 inches   Waist to Hip Ratio 1 %   BMI (Calculated) 39.8       Nutrition Therapy Plan and Nutrition Goals:     Nutrition Therapy & Goals - 05/09/15 0900    Nutrition Therapy   Diet Plans to meet with dietitian; likes fried foods and snacks all day, especially since he stopped smoking; needs encouragement to drink more fluids - only 3 glasses of water/day, some coffee      Nutrition Discharge: Rate Your Plate Scores:   Psychosocial: Target Goals: Acknowledge presence or absence of depression, maximize coping skills, provide positive support system. Participant is able to verbalize types and ability to use techniques and skills needed for reducing stress and depression.  Initial Review & Psychosocial Screening:     Initial Psych Review & Screening - 05/09/15 0900    Initial Review   Current issues with History of Depression   Family Dynamics   Good Support System? Yes   Comments Mr Swiney has a history of depression and has  counciled with therapists. He has good support from his daughter and mother and has his [place] faith. He has a history of past drug use and chronic pain.   Barriers   Psychosocial barriers to participate in program The patient should benefit from training in stress management and relaxation.   Screening Interventions   Interventions Encouraged to exercise;Program counselor consult      Quality of Life Scores:     Quality of Life - 05/09/15 0900    Quality of Life Scores   Health/Function Pre 5.56 %   Socioeconomic Pre 13.71 %   Psych/Spiritual Pre 6.29 %   Family Pre 22.5 %   GLOBAL Pre 9.38 %      PHQ-9:     Recent Review Flowsheet Data    Depression screen [facility] 2/9 05/09/2015   Decreased Interest 3   Down, Depressed, Hopeless 3   PHQ - 2 Score 6   Altered sleeping 3   Tired, decreased energy 3   Change in appetite 3   Feeling bad or failure about yourself  3   Trouble concentrating 1   Moving slowly or fidgety/restless 1   Suicidal thoughts 0   PHQ-9 Score 20   Difficult doing work/chores Very difficult      Psychosocial Evaluation and Intervention:     Psychosocial Evaluation - 06/07/15 1015    Psychosocial Evaluation & Interventions   Comments Counselor met with Mr. Russey today for initial psychosocial evaluation.  He is a 60 yr old gentleman who suffers from COPD and many other health issues, including Fibromyalgia, arthritis and chronic pain as a result.  Mr. Acton has a daughter who is a strong support for him.  He is also actively involved in his local faith community.  Mr. Gonnella reports not sleeping well due to sleep apnea, although he has a CPAP, but reports his chronic pain prevents restful sleep.  He also states he  has a history of depression and anxiety but no current symptoms.  However, his PHQ-9 scores indicate with a score of 20, that depression is a current issue.  Mr. Meschke stated he had been on medication for his mood several years  ago, but has not been  treated since that time.  His current stressors are his health, not sleeping, and his chronic pain.  Counselor recommended he speak with his doctor about possibly addressing his mood issues with medication and for Mr. Ramroop to also consider seeing a counselor again to provide support during this difficult time.  He agreed to do so.  One of his goals is to lose weight, so he is encouraged to exercise consistently and meet with the dietician to help address this goal.  Counselor will folllow up with Mr. Ndiaye on these recommendations.        Psychosocial Re-Evaluation:  Education: Education Goals: Education classes will be provided on a weekly basis, covering required topics. Participant will state understanding/return demonstration of topics presented.  Learning Barriers/Preferences:     Learning Barriers/Preferences - 05/09/15 0900    Learning Barriers/Preferences   Learning Barriers None   Learning Preferences Group Instruction;Individual Instruction;Pictoral;Skilled Demonstration;Verbal Instruction;Video;Written Material      Education Topics: Initial Evaluation Education: - Verbal, written and demonstration of respiratory meds, RPE/PD scales, oximetry and breathing techniques. Instruction on use of nebulizers and MDIs: cleaning and proper use, rinsing mouth with steroid doses and importance of monitoring MDI activations.          Pulmonary Rehab from 06/09/2015 in [place]   Date  05/09/15   Educator  LB   Instruction Review Code  2- meets goals/outcomes      General Nutrition Guidelines/Fats and Fiber: -Group instruction provided by verbal, written material, models and posters to present the general guidelines for heart healthy nutrition. Gives an explanation and review of dietary fats and fiber.   Controlling Sodium/Reading Food Labels: -Group verbal and written material supporting the discussion of sodium  use in heart healthy nutrition. Review and explanation with models, verbal and written materials for utilization of the food label.   Exercise Physiology & Risk Factors: - Group verbal and written instruction with models to review the exercise physiology of the cardiovascular system and associated critical values. Details cardiovascular disease risk factors and the goals associated with each risk factor.   Aerobic Exercise & Resistance Training: - Gives group verbal and written discussion on the health impact of inactivity. On the components of aerobic and resistive training programs and the benefits of this training and how to safely progress through these programs.   Flexibility, Balance, General Exercise Guidelines: - Provides group verbal and written instruction on the benefits of flexibility and balance training programs. Provides general exercise guidelines with specific guidelines to those with heart or lung disease. Demonstration and skill practice provided.   Stress Management: - Provides group verbal and written instruction about the health risks of elevated stress, cause of high stress, and healthy ways to reduce stress.   Depression: - Provides group verbal and written instruction on the correlation between heart/lung disease and depressed mood, treatment options, and the stigmas associated with seeking treatment.   Exercise & Equipment Safety: - Individual verbal instruction and demonstration of equipment use and safety with use of the equipment.      Pulmonary Rehab from 06/09/2015 in [place]   Date  05/15/15   Educator  LB   Instruction Review Code  2- meets goals/outcomes      Infection Prevention: - Provides verbal and written material to individual with discussion of infection control including proper hand washing and proper equipment cleaning during exercise session.      Pulmonary Rehab from 06/09/2015 in  [place] [facility]   Date  05/15/15   Educator  LB   Instruction Review Code  2- meets goals/outcomes      Falls Prevention: - Provides verbal and written material to individual with discussion of falls prevention and safety.      Pulmonary Rehab from 06/09/2015 in [place]   Date  05/09/15   Educator  LB   Instruction Review Code  2- meets goals/outcomes      Diabetes: - Individual verbal and written instruction to review signs/symptoms of diabetes, desired ranges of glucose level fasting, after meals and with exercise. Advice that pre and post exercise glucose checks will be done for 3 sessions at entry of program.   Chronic Lung Diseases: - Group verbal and written instruction to review new updates, new respiratory medications, new advancements in procedures and treatments. Provide informative websites and "800" numbers of self-education.      Pulmonary Rehab from 06/09/2015 in [place]   Date  06/09/15 [facility]]   Educator  CE   Instruction Review Code  2- meets goals/outcomes      Lung Procedures: - Group verbal and written instruction to describe testing methods done to diagnose lung disease. Review the outcome of test results. Describe the treatment choices: Pulmonary Function Tests, ABGs and oximetry.   Energy Conservation: - Provide group verbal and written instruction for methods to conserve energy, plan and organize activities. Instruct on pacing techniques, use of adaptive equipment and posture/positioning to relieve shortness of breath.      Pulmonary Rehab from 06/09/2015 in [place]   Date  05/17/15   Educator  SW   Instruction Review Code  2- meets goals/outcomes      Triggers: - Group verbal and written instruction to review types of environmental controls: home humidity, furnaces, filters, dust mite/pet prevention, HEPA  vacuums. To discuss weather changes, air quality and the benefits of nasal washing.   Exacerbations: - Group verbal and written instruction to provide: warning signs, infection symptoms, calling MD promptly, preventive modes, and value of vaccinations. Review: effective airway clearance, coughing and/or vibration techniques. Create an Sports administrator.   Oxygen: - Individual and group verbal and written instruction on oxygen therapy. Includes supplement oxygen, available portable oxygen systems, continuous and intermittent flow rates, oxygen safety, concentrators, and Medicare reimbursement for oxygen.      Pulmonary Rehab from 06/09/2015 in [place]   Date  05/09/15   Educator  LB   Instruction Review Code  2- meets goals/outcomes      Respiratory Medications: - Group verbal and written instruction to review medications for lung disease. Drug class, frequency, complications, importance of spacers, rinsing mouth after steroid MDI's, and proper cleaning methods for nebulizers.      Pulmonary Rehab from 06/09/2015 in [place]   Date  05/09/15   Educator  LB   Instruction Review Code  2- meets goals/outcomes      AED/CPR: - Group verbal and written instruction with the use of models to demonstrate the basic use of the AED with the basic ABC's of resuscitation.   Breathing Retraining: - Provides individuals verbal and written instruction on purpose, frequency, and proper technique of diaphragmatic breathing and pursed-lipped breathing. Applies individual practice skills.      Pulmonary Rehab from 06/09/2015 in [place]  Date  05/09/15   Educator  LB   Instruction Review Code  2- meets goals/outcomes      Anatomy and Physiology of the Lungs: - Group verbal and written instruction with the use of models to provide basic lung anatomy and physiology related to function,  structure and complications of lung disease.   Heart Failure: - Group verbal and written instruction on the basics of heart failure: signs/symptoms, treatments, explanation of ejection fraction, enlarged heart and cardiomyopathy.   Sleep Apnea: - Individual verbal and written instruction to review Obstructive Sleep Apnea. Review of risk factors, methods for diagnosing and types of masks and machines for OSA.      Pulmonary Rehab from 06/09/2015 in [place]   Date  05/09/15   Educator  LB   Instruction Review Code  2- meets goals/outcomes      Anxiety: - Provides group, verbal and written instruction on the correlation between heart/lung disease and anxiety, treatment options, and management of anxiety.   Relaxation: - Provides group, verbal and written instruction about the benefits of relaxation for patients with heart/lung disease. Also provides patients with examples of relaxation techniques.   Knowledge Questionnaire Score:     Knowledge Questionnaire Score - 05/09/15 0900    Knowledge Questionnaire Score   Pre Score -3      Personal Goals and Risk Factors at Admission:     Personal Goals and Risk Factors at Admission - 05/09/15 0900    Personal Goals and Risk Factors on Admission    Weight Management Yes   Intervention Learn and follow the exercise and diet guidelines while in the program. Utilize the nutrition and education classes to help gain knowledge of the diet and exercise expectations in the program  Plans to meet with dietitian; likes fried foods and snacks all day, especially since he stopped smoking; needs encouragement to drink more fluids - only 3 glasses of water/day, some coffee   Admit Weight 277 lb (125.646 kg)   Goal Weight 200 lb (90.719 kg)   Increase Aerobic Exercise and Physical Activity Yes   Intervention While in program, learn and follow the exercise prescription taught. Start at a low level workload and  increase workload after able to maintain previous level for 30 minutes. Increase time before increasing intensity.  Mr Mcbain would like to be more active, especially daily activites. He has a [employer] and in the past, he exercised in the pool - walking and swimming, because less pain.   Understand more about Heart/Pulmonary Disease. Yes   Intervention --  Mr Clos has COPD and CHF and would like tolearn more about these diseases and management.    Improve shortness of breath with ADL's Yes   Intervention While in program, learn and follow the exercise prescription taught. Start at a low level workload and increase workload ad advised by the exercise physiologist. Increase time before increasing intensity.  Mr Simonis has severe shortness of breath with activity . His SOB questionnaire was scored high at 78.   Develop more efficient breathing techniques such as purse lipped breathing and diaphragmatic breathing; and practicing self-pacing with activity Yes   Intervention While in program, learn and utilize the specific breathing techniques taught to you. Continue to practice and use the techniques as needed.  Mr Kazmierski has been using PLB, but did not know it was a named technique. I encouraged him to use PLB with activity in class and at home.   Increase  knowledge of respiratory medications and ability to use respiratory devices properly.  Yes   Intervention While in program learn and demonstrate appropriate use of your oxygen therapy by increasing flow with exertion, manage oxygen tank operation, including continuous and intermittent flow.  Understanding oxygen is a drug ordered by your physician.;While in program, learn to administer MDI, nebulizer, and spacer properly.;Learn to take respiratory medicine as ordered.;While in program, learn to Clean MDI, nebulizers, and spacers properly.  Mr Courington takes Advair, Spiriva, and Proair. I educated him on a spacer, but will give him  one when ordered. He wears 2l/m Oxygen and uses a small gas portable tank from [employer]. He has CPAP, but cannot tolerate a full mask  - plans to change  mask.   Hypertension Yes   Goal Participant will see blood pressure controlled within the values of 140/75m/Hg or within value directed by their physician.   Intervention Provide nutrition & aerobic exercise along with prescribed medications to achieve BP 140/90 or less.      Personal Goals and Risk Factors Review:      Goals and Risk Factor Review      05/15/15 1000 05/17/15 1038 05/29/15 1000 05/29/15 1026 05/31/15 1000   Increase Aerobic Exercise and Physical Activity   Goals Progress/Improvement seen     Yes    Comments    JKevanhas made significant increases in exercise very quickly.  Noticed both by patient as well as staff.    Take Less Medication   Goals Progress/Improvement seen  No      Comments  Oxygen has been increased to 3 L/m during exertion, 2 L/m at rest      Understand more about Heart/Pulmonary Disease   Goals Progress/Improvement seen    Yes     Comments   Discussed with Mr DNewshamabout his COPD. He wants to learn more about this condition. His physician did mention a diagnosis of asthma. He did have asthma as a child and out grew it, but he needs more understanding of COPD.     Improve shortness of breath with ADL's   Goals Progress/Improvement seen    Yes     Comments   Mr DTanorihas shortness of breath with activity and does state that his pain causes shortness of breath as well. His PD Scales are acceptable at "3".     Breathing Techniques   Goals Progress/Improvement seen  Yes   Yes    Comments Mr DClaytorwas using PLB with his exercise goals - it helps with his shortness of breath and pain.   working on PLB with exercise    Increase knowledge of respiratory medications   Goals Progress/Improvement seen    Yes     Comments   Mr DChaputhas been using 2l/m with his exercise goals and has maintained  acceptable O2Sat's, but he is able to increase his oxygen to 3l/m as needed.      Hypertension   Goal    Participant will see blood pressure controlled within the values of 140/948mHg or within value directed by their physician. --  Mr DiMittmanrrived to class today with increased fluid retention and high BP of  190/98. After sitting for several minutes, the BP did come down to 158/96. Mr DiLoudermilks physician was called and education was done on salt intake.       Personal Goals Discharge (Final Personal Goals and Risk Factors Review):      Goals and Risk Factor  Review - 05/31/15 1000    Hypertension   Goal --  Mr Mancia arrived to class today with increased fluid retention and high BP of  190/98. After sitting for several minutes, the BP did come down to 158/96. Mr Capek 's physician was called and education was done on salt intake.       ITP Comments:     ITP Comments      05/31/15 1139 06/27/15 0655 07/24/15 0847       ITP Comments Patient presented today with more fluid buildup Did not feel like exercising.  Discussed possible causes of fluid retention, and then the changes to make so it would not continue to happen.  Patient verbalized understanding, and will return Friday. Mr Dowe last attended 06/09/2015. He has had medical issues with fluid retention and increased blood pressure. He plans to return to [place] as soon as possible. Mr Funke was contacted 07/12/2015. He has been sick and has car troubles. He last attended 06/09/2015.        Comments: 30 day review note

## 2015-08-22 ENCOUNTER — Other Ambulatory Visit: Payer: Self-pay

## 2015-08-22 DIAGNOSIS — J449 Chronic obstructive pulmonary disease, unspecified: Secondary | ICD-10-CM

## 2015-08-23 ENCOUNTER — Ambulatory Visit
Admission: RE | Admit: 2015-08-23 | Discharge: 2015-08-23 | Disposition: A | Discharge status: Home / Self Care | Payer: Medicaid Other | Source: Ambulatory Visit | Attending: Internal Medicine | Admitting: Internal Medicine

## 2015-08-23 DIAGNOSIS — J449 Chronic obstructive pulmonary disease, unspecified: Secondary | ICD-10-CM | POA: Diagnosis not present

## 2015-08-23 DIAGNOSIS — R918 Other nonspecific abnormal finding of lung field: Secondary | ICD-10-CM | POA: Insufficient documentation

## 2015-08-28 ENCOUNTER — Telehealth: Payer: Self-pay

## 2015-08-28 NOTE — Telephone Encounter (Signed)
Left message on machine.

## 2015-08-30 ENCOUNTER — Telehealth: Payer: Self-pay

## 2015-08-30 NOTE — Telephone Encounter (Signed)
Received cardiac clearance request for pt to proceed w/ colonoscopy on 09/11/15. Per Dr. Rockey Situ, pt is cleared to proceed w/ no med changes. Faxed to [facility] @ [phone].

## 2015-09-05 ENCOUNTER — Encounter: Payer: Self-pay | Admitting: Respiratory Therapy

## 2015-09-05 DIAGNOSIS — J449 Chronic obstructive pulmonary disease, unspecified: Secondary | ICD-10-CM

## 2015-09-05 NOTE — Progress Notes (Signed)
Pulmonary Individual Treatment Plan  Patient Details  Name: Hunter White MRN: [id number] Date of Birth: 1955-10-22 Referring Provider:  Dr Vilinda Boehringer  Initial Encounter Date: 05/09/2015  Visit Diagnosis: COPD, severe ([facility])  Patient's Home Medications on Admission:  Current outpatient prescriptions:    acetaminophen (TYLENOL) 650 MG CR tablet, Take 1,300 mg by mouth every 8 (eight) hours as needed for pain., Disp: , Rfl:    albuterol (PROVENTIL HFA;VENTOLIN HFA) 108 (90 BASE) MCG/ACT inhaler, Inhale 1-2 puffs into the lungs every 4 (four) hours as needed for wheezing or shortness of breath., Disp: 1 Inhaler, Rfl: 6   esomeprazole (NEXIUM) 40 MG capsule, Take 40 mg by mouth 2 (two) times daily before a meal., Disp: , Rfl:    Fenofibrate (TRICOR PO), Take 1 tablet by mouth daily. Patient unsure of dosage., Disp: , Rfl:    Fluticasone-Salmeterol (ADVAIR) 500-50 MCG/DOSE AEPB, Inhale 1 puff into the lungs 2 (two) times daily., Disp: 60 each, Rfl: 4   furosemide (LASIX) 40 MG tablet, Take 80 mg by mouth daily., Disp: , Rfl:    lisinopril (PRINIVIL,ZESTRIL) 40 MG tablet, Take 40 mg by mouth daily., Disp: , Rfl:    potassium chloride (K-DUR) 10 MEQ tablet, Take 1 tablet (10 mEq total) by mouth daily., Disp: 90 tablet, Rfl: 3   pregabalin (LYRICA) 50 MG capsule, Take 150 mg by mouth 2 (two) times daily. , Disp: , Rfl:    tiotropium (SPIRIVA) 18 MCG inhalation capsule, Place 1 capsule (18 mcg total) into inhaler and inhale daily., Disp: 30 capsule, Rfl: 6  Past Medical History: Past Medical History  Diagnosis Date   Hypertensive heart disease    Chronic diastolic CHF (congestive heart failure) ([place])     a. 04/2015 Echo: EF 55-60%, no rwma, mildly dil LA/RV/RA, PASP 4mHg.   COPD (chronic obstructive pulmonary disease) ([place]     a. Uses O2 @ 3lpm via [place] around the clock.   Depression    GERD (gastroesophageal reflux disease)    Arthritis    Fibromyalgia    Morbid  obesity ([place]    Osteoarthritis     a. Back, neck, and bilateral knee pain->severely limits activity.    Tobacco Use: History  Smoking status   Former Smoker -- 0.25 packs/day for 35 years   Types: Cigarettes   Quit date: 01/26/2015  Smokeless tobacco   Former [employer] developer  Quit date: 11/22/1973    Labs: Recent Review Flowsheet Data    There is no flowsheet data to display.       ADL UCSD:     ADL UCSD      05/09/15 0900       ADL UCSD   ADL Phase Entry     SOB Score total 78     Rest 1     Walk 5     Stairs 5     Bath 5     Dress 5     Shop 5         Pulmonary Function Assessment:     Pulmonary Function Assessment - 05/09/15 0900    Pulmonary Function Tests   RV% 220 %   DLCO% 54 %   Initial Spirometry Results   FVC% 50 %   FEV1% 29 %   FEV1/FVC Ratio 45   Post Bronchodilator Spirometry Results   FVC% 58 %   FEV1% 35 %   FEV1/FVC Ratio 45   Breath   Bilateral Breath Sounds Clear;Decreased  Shortness of Breath Yes;Fear of Shortness of Breath;Limiting activity      Exercise Target Goals:    Exercise Program Goal: Individual exercise prescription set with THRR, safety & activity barriers. Participant demonstrates ability to understand and report RPE using BORG scale, to self-measure pulse accurately, and to acknowledge the importance of the exercise prescription.  Exercise Prescription Goal: Starting with aerobic activity 30 plus minutes a day, 3 days per week for initial exercise prescription. Provide home exercise prescription and guidelines that participant acknowledges understanding prior to discharge.  Activity Barriers & Risk Stratification:     Activity Barriers & Cardiac Risk Stratification - 05/09/15 0900    Activity Barriers & Cardiac Risk Stratification   Activity Barriers Joint Problems;Fibromyalgia;Deconditioning;Muscular Weakness;Shortness of Breath   Cardiac Risk Stratification High      6 Minute Walk:     6 Minute  Walk      05/09/15 1415       6 Minute Walk   Phase Initial     Distance 400 feet     Walk Time 4 minutes     RPE 18     Perceived Dyspnea  8     Symptoms Yes (comment)     Comments SOB, fatigue     Resting HR 91 bpm     Resting BP 146/76 mmHg     Max Ex. HR 118 bpm     Max Ex. BP 182/80 mmHg        Initial Exercise Prescription:     Initial Exercise Prescription - 05/09/15 1400    Date of Initial Exercise Prescription   Date 05/09/15   Treadmill   MPH 2   Grade 0   Minutes 10   Recumbant Bike   Level 2   RPM 40   Watts 20   Minutes 10   NuStep   Level 2   Watts 40   Minutes 10   Arm Ergometer   Level 1   Watts 10   Minutes 10   Recumbant Elliptical   Level 2   RPM 40   Watts 20   Minutes 10   REL-XR   Level 2   Watts 40   Minutes 10   Prescription Details   Frequency (times per week) 3   Duration Progress to 30 minutes of continuous aerobic without signs/symptoms of physical distress   Intensity   THRR REST +  30   Ratings of Perceived Exertion 11-15   Perceived Dyspnea 2-4   Progression   Progression Continue progressive overload as per policy without signs/symptoms or physical distress.   Resistance Training   Training Prescription Yes   Weight 2   Reps 10-15      Exercise Prescription Changes:     Exercise Prescription Changes      05/15/15 1500 05/17/15 1000 05/29/15 1639       Exercise Review   Progression   Yes     Response to Exercise   Blood Pressure (Admit) 150/80 mmHg  140/80 mmHg     Blood Pressure (Exercise) 180/88 mmHg  142/84 mmHg     Blood Pressure (Exit) 130/86 mmHg  140/82 mmHg     Heart Rate (Admit) 79 bpm  90 bpm     Heart Rate (Exercise) 92 bpm  100 bpm     Heart Rate (Exit) 85 bpm  84 bpm     Oxygen Saturation (Admit) 93 %  2l/m intermittent  91 %     Oxygen Saturation (  Exercise) 93 %  2l/m Continuous  92 %     Oxygen Saturation (Exit) 95 %  95 %     Rating of Perceived Exertion (Exercise) 15  12      Perceived Dyspnea (Exercise) 4  3     Symptoms  None None     Comments  Reviewed gym orientation and the equipment functions and settings. Procedures and policies of the gym were outlined and explained. The patient's individual exercise prescription and treatment plan were reviewed with them. All starting workloads were established based on the results of the functional testing  done at the initial intake visit. The plan for exercise progression was also introduced and progression will be customized based on the patient's performance and goals.       Duration  Progress to 30 minutes of continuous aerobic without signs/symptoms of physical distress Progress to 30 minutes of continuous aerobic without signs/symptoms of physical distress     Intensity  Rest + 30 Rest + 30     Progression   Progression  Continue progressive overload as per policy without signs/symptoms or physical distress. Continue progressive overload as per policy without signs/symptoms or physical distress.     Resistance Training   Training Prescription (read-only) Yes Yes Yes     Weight (read-only) _0 Reps (read-only) 10-12 10-12 10-12     Interval Training   Interval Training No No No     Recumbant Bike   Level (read-only) 1  BioStep 1  BioStep 1  BioStep     Watts (read-only) _1 Minutes (read-only) _2 NuStep   Level (read-only) _3 Watts (read-only) 60 60 60     Minutes (read-only) _4 Arm Ergometer   Level (read-only) _5 Watts (read-only) _6 Minutes (read-only) _7 Discharge Exercise Prescription (Final Exercise Prescription Changes):     Exercise Prescription Changes - 05/29/15 1639    Exercise Review   Progression Yes   Response to Exercise   Blood Pressure (Admit) 140/80 mmHg   Blood Pressure (Exercise) 142/84 mmHg   Blood Pressure (Exit) 140/82 mmHg   Heart Rate (Admit) 90 bpm   Heart Rate (Exercise) 100 bpm   Heart Rate (Exit) 84  bpm   Oxygen Saturation (Admit) 91 %   Oxygen Saturation (Exercise) 92 %   Oxygen Saturation (Exit) 95 %   Rating of Perceived Exertion (Exercise) 12   Perceived Dyspnea (Exercise) 3   Symptoms None   Duration Progress to 30 minutes of continuous aerobic without signs/symptoms of physical distress   Intensity Rest + 30   Progression   Progression Continue progressive overload as per policy without signs/symptoms or physical distress.   Resistance Training   Training Prescription (read-only) Yes   Weight (read-only) 2   Reps (read-only) 10-12   Interval Training   Interval Training No   Recumbant Bike   Level (read-only) 1  BioStep   Watts (read-only) 20   Minutes (read-only) 8   NuStep   Level (read-only) 1   Watts (read-only) 60   Minutes (read-only) 10   Arm Ergometer   Level (read-only) 1   Watts (read-only) 15   Minutes (read-only) 10       Nutrition:  Target Goals: Understanding of nutrition guidelines, daily intake of sodium <1567m, cholesterol <2064m calories 30% from fat and 7% or less from saturated fats, daily to have 5 or more servings of fruits and vegetables.  Biometrics:     Pre Biometrics - 05/09/15 1422    Pre Biometrics   Height _0  (1.778 m)   Weight 277 lb (125.646 kg)   Waist Circumference 50.5 inches   Hip Circumference 50.5 inches   Waist to Hip Ratio 1 %   BMI (Calculated) 39.8       Nutrition Therapy Plan and Nutrition Goals:     Nutrition Therapy & Goals - 05/09/15 0900    Nutrition Therapy   Diet Plans to meet with dietitian; likes fried foods and snacks all day, especially since he stopped smoking; needs encouragement to drink more fluids - only 3 glasses of water/day, some coffee      Nutrition Discharge: Rate Your Plate Scores:   Psychosocial: Target Goals: Acknowledge presence or absence of depression, maximize coping skills, provide positive support system. Participant is able to verbalize types and ability to use  techniques and skills needed for reducing stress and depression.  Initial Review & Psychosocial Screening:     Initial Psych Review & Screening - 05/09/15 0900    Initial Review   Current issues with History of Depression   Family Dynamics   Good Support System? Yes   Comments Mr DiCechas a history of depression and has counciled with therapists. He has good support from his daughter and mother and has his ChPanamaaith. He has a history of past drug use and chronic pain.   Barriers   Psychosocial barriers to participate in program The patient should benefit from training in stress management and relaxation.   Screening Interventions   Interventions Encouraged to exercise;Program counselor consult      Quality of Life Scores:     Quality of Life - 05/09/15 0900    Quality of Life Scores   Health/Function Pre 5.56 %   Socioeconomic Pre 13.71 %   Psych/Spiritual Pre 6.29 %   Family Pre 22.5 %   GLOBAL Pre 9.38 %      PHQ-9:     Recent Review Flowsheet Data    [facility]   Decreased Interest 3   Down, Depressed, Hopeless 3   PHQ - 2 Score 6   Altered sleeping 3   Tired, decreased energy 3   Change in appetite 3   Feeling bad or failure about yourself  3   Trouble concentrating 1   Moving slowly or fidgety/restless 1   Suicidal thoughts 0   PHQ-9 Score 20   Difficult doing work/chores Very difficult      Psychosocial Evaluation and Intervention:     Psychosocial Evaluation - 06/07/15 1015    Psychosocial Evaluation & Interventions   Comments Counselor met with Mr. DiKaczmarczykoday for initial psychosocial evaluation.  He is a [age]r old gentleman who suffers from COPD and many other health issues, including Fibromyalgia, arthritis and chronic pain as a result.  Mr. DiGiulianias a daughter who is a strong support for him.  He is also actively involved in his local faith community.  Mr. DiTrickeleports not sleeping well due to sleep  apnea, although he has a CPAP, but reports his chronic pain prevents restful sleep.  He also states he  has a history of depression and anxiety but no current symptoms.  However,  his PHQ-9 scores indicate with a score of 20, that depression is a current issue.  Mr. Brinkmeier stated he had been on medication for his mood several years ago, but has not been treated since that time.  His current stressors are his health, not sleeping, and his chronic pain.  Counselor recommended he speak with his doctor about possibly addressing his mood issues with medication and for Mr. Mimbs to also consider seeing a counselor again to provide support during this difficult time.  He agreed to do so.  One of his goals is to lose weight, so he is encouraged to exercise consistently and meet with the dietician to help address this goal.  Counselor will folllow up with Mr. Minasyan on these recommendations.        Psychosocial Re-Evaluation:  Education: Education Goals: Education classes will be provided on a weekly basis, covering required topics. Participant will state understanding/return demonstration of topics presented.  Learning Barriers/Preferences:     Learning Barriers/Preferences - 05/09/15 0900    Learning Barriers/Preferences   Learning Barriers None   Learning Preferences Group Instruction;Individual Instruction;Pictoral;Skilled Demonstration;Verbal Instruction;Video;Written Material      Education Topics: Initial Evaluation Education: - Verbal, written and demonstration of respiratory meds, RPE/PD scales, oximetry and breathing techniques. Instruction on use of nebulizers and MDIs: cleaning and proper use, rinsing mouth with steroid doses and importance of monitoring MDI activations.          Pulmonary Rehab from 06/09/2015 in [place]   Date  05/09/15   Educator  LB   Instruction Review Code  2- meets goals/outcomes      General Nutrition  Guidelines/Fats and Fiber: -Group instruction provided by verbal, written material, models and posters to present the general guidelines for heart healthy nutrition. Gives an explanation and review of dietary fats and fiber.   Controlling Sodium/Reading Food Labels: -Group verbal and written material supporting the discussion of sodium use in heart healthy nutrition. Review and explanation with models, verbal and written materials for utilization of the food label.   Exercise Physiology & Risk Factors: - Group verbal and written instruction with models to review the exercise physiology of the cardiovascular system and associated critical values. Details cardiovascular disease risk factors and the goals associated with each risk factor.   Aerobic Exercise & Resistance Training: - Gives group verbal and written discussion on the health impact of inactivity. On the components of aerobic and resistive training programs and the benefits of this training and how to safely progress through these programs.   Flexibility, Balance, General Exercise Guidelines: - Provides group verbal and written instruction on the benefits of flexibility and balance training programs. Provides general exercise guidelines with specific guidelines to those with heart or lung disease. Demonstration and skill practice provided.   Stress Management: - Provides group verbal and written instruction about the health risks of elevated stress, cause of high stress, and healthy ways to reduce stress.   Depression: - Provides group verbal and written instruction on the correlation between heart/lung disease and depressed mood, treatment options, and the stigmas associated with seeking treatment.   Exercise & Equipment Safety: - Individual verbal instruction and demonstration of equipment use and safety with use of the equipment.      Pulmonary Rehab from 06/09/2015 in [place]   Date   05/15/15   Educator  LB   Instruction Review Code  2- meets goals/outcomes      Infection  Prevention: - Provides verbal and written material to individual with discussion of infection control including proper hand washing and proper equipment cleaning during exercise session.      Pulmonary Rehab from 06/09/2015 in [place]   Date  05/15/15   Educator  LB   Instruction Review Code  2- meets goals/outcomes      Falls Prevention: - Provides verbal and written material to individual with discussion of falls prevention and safety.      Pulmonary Rehab from 06/09/2015 in [place]   Date  05/09/15   Educator  LB   Instruction Review Code  2- meets goals/outcomes      Diabetes: - Individual verbal and written instruction to review signs/symptoms of diabetes, desired ranges of glucose level fasting, after meals and with exercise. Advice that pre and post exercise glucose checks will be done for 3 sessions at entry of program.   Chronic Lung Diseases: - Group verbal and written instruction to review new updates, new respiratory medications, new advancements in procedures and treatments. Provide informative websites and "800" numbers of self-education.      Pulmonary Rehab from 06/09/2015 in [place]   Date  06/09/15 [facility] Numbers]   Educator  CE   Instruction Review Code  2- meets goals/outcomes      Lung Procedures: - Group verbal and written instruction to describe testing methods done to diagnose lung disease. Review the outcome of test results. Describe the treatment choices: Pulmonary Function Tests, ABGs and oximetry.   Energy Conservation: - Provide group verbal and written instruction for methods to conserve energy, plan and organize activities. Instruct on pacing techniques, use of adaptive equipment and posture/positioning to relieve shortness of  breath.      Pulmonary Rehab from 06/09/2015 in [place]   Date  05/17/15   Educator  SW   Instruction Review Code  2- meets goals/outcomes      Triggers: - Group verbal and written instruction to review types of environmental controls: home humidity, furnaces, filters, dust mite/pet prevention, HEPA vacuums. To discuss weather changes, air quality and the benefits of nasal washing.   Exacerbations: - Group verbal and written instruction to provide: warning signs, infection symptoms, calling MD promptly, preventive modes, and value of vaccinations. Review: effective airway clearance, coughing and/or vibration techniques. Create an Sports administrator.   Oxygen: - Individual and group verbal and written instruction on oxygen therapy. Includes supplement oxygen, available portable oxygen systems, continuous and intermittent flow rates, oxygen safety, concentrators, and Medicare reimbursement for oxygen.      Pulmonary Rehab from 06/09/2015 in [place]   Date  05/09/15   Educator  LB   Instruction Review Code  2- meets goals/outcomes      Respiratory Medications: - Group verbal and written instruction to review medications for lung disease. Drug class, frequency, complications, importance of spacers, rinsing mouth after steroid MDI's, and proper cleaning methods for nebulizers.      Pulmonary Rehab from 06/09/2015 in [place]   Date  05/09/15   Educator  LB   Instruction Review Code  2- meets goals/outcomes      AED/CPR: - Group verbal and written instruction with the use of models to demonstrate the basic use of the AED with the basic ABC's of resuscitation.   Breathing Retraining: - Provides individuals verbal and written instruction  on purpose, frequency, and proper technique of diaphragmatic breathing and pursed-lipped breathing. Applies individual practice skills.       Pulmonary Rehab from 06/09/2015 in [place]   Date  05/09/15   Educator  LB   Instruction Review Code  2- meets goals/outcomes      Anatomy and Physiology of the Lungs: - Group verbal and written instruction with the use of models to provide basic lung anatomy and physiology related to function, structure and complications of lung disease.   Heart Failure: - Group verbal and written instruction on the basics of heart failure: signs/symptoms, treatments, explanation of ejection fraction, enlarged heart and cardiomyopathy.   Sleep Apnea: - Individual verbal and written instruction to review Obstructive Sleep Apnea. Review of risk factors, methods for diagnosing and types of masks and machines for OSA.      Pulmonary Rehab from 06/09/2015 in [place]   Date  05/09/15   Educator  LB   Instruction Review Code  2- meets goals/outcomes      Anxiety: - Provides group, verbal and written instruction on the correlation between heart/lung disease and anxiety, treatment options, and management of anxiety.   Relaxation: - Provides group, verbal and written instruction about the benefits of relaxation for patients with heart/lung disease. Also provides patients with examples of relaxation techniques.   Knowledge Questionnaire Score:     Knowledge Questionnaire Score - 05/09/15 0900    Knowledge Questionnaire Score   Pre Score -3      Personal Goals and Risk Factors at Admission:     Personal Goals and Risk Factors at Admission - 05/09/15 0900    Core Components/Risk Factors/Patient Goals on Admission    Weight Management Yes   Intervention (read-only) Learn and follow the exercise and diet guidelines while in the program. Utilize the nutrition and education classes to help gain knowledge of the diet and exercise expectations in the program  Plans to meet with dietitian; likes fried foods and snacks all  day, especially since he stopped smoking; needs encouragement to drink more fluids - only 3 glasses of water/day, some coffee   Admit Weight 277 lb (125.646 kg)   Goal Weight: Short Term 200 lb (90.719 kg)   Sedentary Yes   Intervention (read-only) While in program, learn and follow the exercise prescription taught. Start at a low level workload and increase workload after able to maintain previous level for 30 minutes. Increase time before increasing intensity.  Mr Cali would like to be more active, especially daily activites. He has a [employer] and in the past, he exercised in the pool - walking and swimming, because less pain.   Improve shortness of breath with ADL's Yes   Intervention (read-only) While in program, learn and follow the exercise prescription taught. Start at a low level workload and increase workload ad advised by the exercise physiologist. Increase time before increasing intensity.  Mr Cogbill has severe shortness of breath with activity . His SOB questionnaire was scored high at 78.   Develop more efficient breathing techniques such as purse lipped breathing and diaphragmatic breathing; and practicing self-pacing with activity Yes   Intervention (read-only) While in program, learn and utilize the specific breathing techniques taught to you. Continue to practice and use the techniques as needed.  Mr Cajamarca has been using PLB, but did not know it was a named technique. I encouraged him to use PLB with activity in class and at  home.   Increase knowledge of respiratory medications and ability to use respiratory devices properly  Yes   Intervention (read-only) While in program learn and demonstrate appropriate use of your oxygen therapy by increasing flow with exertion, manage oxygen tank operation, including continuous and intermittent flow.  Understanding oxygen is a drug ordered by your physician.;While in program, learn to administer MDI, nebulizer, and spacer  properly.;Learn to take respiratory medicine as ordered.;While in program, learn to Clean MDI, nebulizers, and spacers properly.  Mr Rack takes Advair, Spiriva, and Proair. I educated him on a spacer, but will give him one when ordered. He wears 2l/m Oxygen and uses a small gas portable tank from [employer]. He has CPAP, but cannot tolerate a full mask  - plans to change  mask.   Hypertension Yes   Goal Participant will see blood pressure controlled within the values of 140/63m/Hg or within value directed by their physician.   Intervention (read-only) Provide nutrition & aerobic exercise along with prescribed medications to achieve BP 140/90 or less.   Understand more about Heart/Pulmonary Disease. Yes   Intervention --  Mr DBurlinghas COPD and CHF and would like tolearn more about these diseases and management.       Personal Goals and Risk Factors Review:      Goals and Risk Factor Review      05/15/15 1000 05/17/15 1038 05/29/15 1000 05/29/15 1026 05/31/15 1000   Core Components/Risk Factors/Patient Goals Review   Personal Goals Review Develop more efficient breathing techniques such as purse lipped breathing and diaphragmatic breathing and practicing self-pacing with activity. Take Less Medication Understand more about Heart/Pulmonary Disease;Improve shortness of breath with ADL's;Increase knowledge of respiratory medications and ability to use respiratory devices properly. Increase Aerobic Exercise and Physical Activity    Increase Aerobic Exercise and Physical Activity (read-only)   Goals Progress/Improvement seen     Yes    Comments    JAbehas made significant increases in exercise very quickly.  Noticed both by patient as well as staff.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

## 2015-09-07 ENCOUNTER — Emergency Department: Payer: Medicaid Other

## 2015-09-07 ENCOUNTER — Encounter: Payer: Self-pay | Admitting: Emergency Medicine

## 2015-09-07 ENCOUNTER — Emergency Department
Admission: EM | Admit: 2015-09-07 | Discharge: 2015-09-07 | Disposition: A | Discharge status: Other Acute Inpatient Hospital | Payer: Medicaid Other | Attending: Emergency Medicine | Admitting: Emergency Medicine

## 2015-09-07 DIAGNOSIS — Z79899 Other long term (current) drug therapy: Secondary | ICD-10-CM | POA: Diagnosis not present

## 2015-09-07 DIAGNOSIS — Y93G3 Activity, cooking and baking: Secondary | ICD-10-CM | POA: Insufficient documentation

## 2015-09-07 DIAGNOSIS — T2024XA Burn of second degree of nose (septum), initial encounter: Secondary | ICD-10-CM | POA: Diagnosis not present

## 2015-09-07 DIAGNOSIS — J441 Chronic obstructive pulmonary disease with (acute) exacerbation: Secondary | ICD-10-CM | POA: Diagnosis not present

## 2015-09-07 DIAGNOSIS — Y9289 Other specified places as the place of occurrence of the external cause: Secondary | ICD-10-CM | POA: Diagnosis not present

## 2015-09-07 DIAGNOSIS — Z7951 Long term (current) use of inhaled steroids: Secondary | ICD-10-CM | POA: Insufficient documentation

## 2015-09-07 DIAGNOSIS — X102XXA Contact with fats and cooking oils, initial encounter: Secondary | ICD-10-CM | POA: Diagnosis not present

## 2015-09-07 DIAGNOSIS — Z9981 Dependence on supplemental oxygen: Secondary | ICD-10-CM | POA: Insufficient documentation

## 2015-09-07 DIAGNOSIS — T2020XA Burn of second degree of head, face, and neck, unspecified site, initial encounter: Secondary | ICD-10-CM

## 2015-09-07 DIAGNOSIS — Z87891 Personal history of nicotine dependence: Secondary | ICD-10-CM | POA: Insufficient documentation

## 2015-09-07 DIAGNOSIS — T2000XA Burn of unspecified degree of head, face, and neck, unspecified site, initial encounter: Secondary | ICD-10-CM | POA: Diagnosis present

## 2015-09-07 DIAGNOSIS — T2026XA Burn of second degree of forehead and cheek, initial encounter: Secondary | ICD-10-CM | POA: Insufficient documentation

## 2015-09-07 DIAGNOSIS — Y998 Other external cause status: Secondary | ICD-10-CM | POA: Insufficient documentation

## 2015-09-07 MED ORDER — FENTANYL CITRATE (PF) 100 MCG/2ML IJ SOLN
75.0000 ug | Freq: Once | INTRAMUSCULAR | Status: AC
  Administered 2015-09-07: 75 ug via INTRAVENOUS
  Filled 2015-09-07: qty 2

## 2015-09-07 MED ORDER — MORPHINE SULFATE (PF) 2 MG/ML IV SOLN
2.0000 mg | Freq: Once | INTRAVENOUS | Status: AC
  Administered 2015-09-07: 2 mg via INTRAVENOUS

## 2015-09-07 MED ORDER — MORPHINE SULFATE (PF) 2 MG/ML IV SOLN
INTRAVENOUS | Status: AC
  Filled 2015-09-07: qty 1

## 2015-09-07 MED ORDER — ONDANSETRON HCL 4 MG/2ML IJ SOLN
4.0000 mg | Freq: Once | INTRAMUSCULAR | Status: AC
  Administered 2015-09-07: 4 mg via INTRAVENOUS

## 2015-09-07 MED ORDER — ONDANSETRON HCL 4 MG/2ML IJ SOLN
INTRAMUSCULAR | Status: AC
  Filled 2015-09-07: qty 2
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
## 2015-09-07 NOTE — ED Notes (Signed)
Pt given cup of water 
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
## 2015-09-11 ENCOUNTER — Ambulatory Visit: Payer: Medicaid Other | Admitting: Pulmonary Disease

## 2015-09-11 ENCOUNTER — Ambulatory Visit: Admission: RE | Admit: 2015-09-11 | Payer: Medicaid Other | Source: Ambulatory Visit | Admitting: Gastroenterology

## 2015-09-11 ENCOUNTER — Encounter: Admission: RE | Payer: Self-pay | Source: Ambulatory Visit

## 2015-09-11 SURGERY — COLONOSCOPY WITH PROPOFOL
Anesthesia: General

## 2015-09-15 ENCOUNTER — Ambulatory Visit: Admission: RE | Admit: 2015-09-15 | Payer: Medicaid Other | Source: Ambulatory Visit

## 2015-09-19 ENCOUNTER — Ambulatory Visit: Payer: Medicaid Other | Admitting: Internal Medicine

## 2015-09-19 ENCOUNTER — Telehealth: Payer: Self-pay | Admitting: Internal Medicine

## 2015-09-19 DIAGNOSIS — R042 Hemoptysis: Secondary | ICD-10-CM

## 2015-09-19 DIAGNOSIS — R918 Other nonspecific abnormal finding of lung field: Secondary | ICD-10-CM

## 2015-09-19 NOTE — Telephone Encounter (Signed)
LMOM for pt to return call to schedule a sooner appt.
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
## 2015-09-19 NOTE — Telephone Encounter (Signed)
Spoke with pt and informed him to come in 3/24 at 8:10am and that he needs CT chest with prior to his appt. Pt agreed. Suanne Marker will call pt with CT chest appt. Nothing further needed.

## 2015-09-19 NOTE — Addendum Note (Signed)
Addended by: Oscar La R on: 09/19/2015 03:27 PM   Modules accepted: Orders

## 2015-09-20 ENCOUNTER — Ambulatory Visit
Admission: RE | Admit: 2015-09-20 | Discharge: 2015-09-20 | Disposition: A | Discharge status: Home / Self Care | Payer: Medicaid Other | Source: Ambulatory Visit | Attending: Internal Medicine | Admitting: Internal Medicine

## 2015-09-20 ENCOUNTER — Telehealth: Payer: Self-pay | Admitting: *Deleted

## 2015-09-20 DIAGNOSIS — I2510 Atherosclerotic heart disease of native coronary artery without angina pectoris: Secondary | ICD-10-CM | POA: Diagnosis not present

## 2015-09-20 DIAGNOSIS — R918 Other nonspecific abnormal finding of lung field: Secondary | ICD-10-CM

## 2015-09-20 DIAGNOSIS — R042 Hemoptysis: Secondary | ICD-10-CM

## 2015-09-20 LAB — POCT I-STAT CREATININE
CREATININE: 3.3 mg/dL — AB (ref 0.61–1.24)
CREATININE: 3.4 mg/dL — AB (ref 0.61–1.24)
Creatinine, Ser: 3.3 mg/dL — ABNORMAL HIGH (ref 0.61–1.24)

## 2015-09-20 MED ORDER — IOPAMIDOL (ISOVUE-370) INJECTION 76%: 75.0000 mL | Freq: Once | INTRAVENOUS | Status: DC | PRN

## 2015-09-20 NOTE — Telephone Encounter (Signed)
creatnine elevated. VM states CT Chest w/o.

## 2015-09-22 ENCOUNTER — Ambulatory Visit (INDEPENDENT_AMBULATORY_CARE_PROVIDER_SITE_OTHER): Payer: Medicaid Other | Admitting: Internal Medicine

## 2015-09-22 ENCOUNTER — Encounter: Payer: Self-pay | Admitting: Internal Medicine

## 2015-09-22 VITALS — BP 128/42 | HR 107 | Ht 70.0 in | Wt 274.8 lb

## 2015-09-22 DIAGNOSIS — J410 Simple chronic bronchitis: Secondary | ICD-10-CM

## 2015-09-22 DIAGNOSIS — N289 Disorder of kidney and ureter, unspecified: Secondary | ICD-10-CM | POA: Diagnosis not present

## 2015-09-22 DIAGNOSIS — J984 Other disorders of lung: Secondary | ICD-10-CM

## 2015-09-22 DIAGNOSIS — N179 Acute kidney failure, unspecified: Secondary | ICD-10-CM

## 2015-09-22 DIAGNOSIS — G4733 Obstructive sleep apnea (adult) (pediatric): Secondary | ICD-10-CM

## 2015-09-22 DIAGNOSIS — J189 Pneumonia, unspecified organism: Secondary | ICD-10-CM | POA: Diagnosis not present

## 2015-09-22 DIAGNOSIS — R0902 Hypoxemia: Secondary | ICD-10-CM

## 2015-09-22 MED ORDER — AMOXICILLIN-POT CLAVULANATE 500-125 MG PO TABS: 1.0000 | ORAL_TABLET | Freq: Two times a day (BID) | ORAL | Status: AC
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
## 2015-09-22 NOTE — Assessment & Plan Note (Signed)
Pulmonary function testing with severe, very severe, obstruction, FEV1 29%, postBD FEV1 35% Patient has stopped smoking and has gained significant weight since then, counseled today on diet and exercise.   Continue with Advair, Spiriva, rescue inhaler Restart pulmonary rehabilitation Restart going to gym, [employer].  Patient with other comorbidities such as obesity, OSA, hypertension, tobacco abuse. Controlling all his comorbidities will assist in his overall quality of life.  Plan: -Advair 500/50 -Spiriva Respimat -Albuterol rescue inhaler -Continue to avoid tobacco -Diet, exercise, weight loss.

## 2015-09-22 NOTE — Assessment & Plan Note (Signed)
Most likely due to over diuresis from Lasix doses. I'll discuss the case with his cardiologist, we have decided to stop his Lasix and his lisinopril for the next week, cardiology will see him in the office and redraw labs and recheck his blood pressure, and adjust his blood pressure medications. Referral to nephrology for acute kidney injury.
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
## 2015-09-22 NOTE — Patient Instructions (Signed)
Follow up with Dr. Stevenson Clinch in: 2 months - repeat CT scan chest w/o contrast prior to follow up for RLL cavitary pneumonia - referral to nephrology ([place]) for renal insufficiency - cont with inhalers - cont with supplemental O2 (3L with exertion, 2L at rest) - please restart using your CPAP machine - Augmentin 500mg  - 1tab po every 12 hours x 14 days - Per cardiology (Dr. Rockey Situ) - hold Lasix and Lisinopril for one week  - Cardiology will call you with a follw up appointment.  - fluid restriction to 1 liter a day

## 2015-09-22 NOTE — Assessment & Plan Note (Signed)
Cont with 3L O2 continuously, even with CPAP

## 2015-09-22 NOTE — Assessment & Plan Note (Signed)
Images reviewed by myself and at thoracic oncology clinic on Thursday, 09/21/2015. Images again reviewed with the patient in person at today's visit. Right lower lobe cavitary pneumonia, high risk for suspected neoplasm given his prolonged 40-pack-year smoking history. It is at the consensus of thoracic oncology clinic and myself that we will treat this as a cavitary pneumonia, most likely aspiration given the location, with another 2 weeks of Augmentin and rescan patient in 6 weeks. The above plan was discussed with the patient and his daughter, they're in agreement with it. Previous images reviewed also, there are no masses going back to his CT scan abdomen and pelvis from October 2016, lower lung views. Chest x-ray did show right lower lobe opacity in January 2017, at that time he was treated with Z-Pak and a steroid Dosepak. Right lower lobe cavitary lesion is more of an acute finding and fits more of an acute process such as infection.  Plan: -Augmentin 500 mg, 1 tab daily by mouth twice a day 14 days

## 2015-09-25 ENCOUNTER — Encounter: Payer: Medicaid Other | Admitting: Internal Medicine

## 2015-09-25 ENCOUNTER — Telehealth: Payer: Self-pay | Admitting: Internal Medicine

## 2015-09-25 NOTE — Progress Notes (Signed)
[facility] Consultation      Assessment and Plan:  Severe obstructive sleep apnea.  Hypertensive heart disease.  COPD. -Being managed by Dr. Stevenson Clinch, currently the patient is oxygen dependent.  Morbid obesity.   Date: 09/25/2015  MRN# [id number] Hunter White 1956-05-12  Referring Physician:   ELBIE White is a 60 y.o. old male seen in consultation for chief complaint of:   No chief complaint on file.   HPI:   The patient is a 60 year old male referred for obstructive sleep apnea. The patient sees Dr. Stevenson Clinch for respiratory issues.  Review of testing: Sleep study from 01/17/2015: AHI of 44-suggestive of severe obstructive sleep apnea. CPAP titration study from 02/17/2015: CPAP was initiated at 15.  SIX MIN WALK 04/24/2015 02/08/2015 12/28/2014 12/28/2014  Medications Abulterol Inhaler, Nexium, Advair, HCTZ, Lisinopril, Percocet, Lyrica, Spiriva (meds taken this am) Tylenol, Nexium, HCTZ, Lisinopril - -  Supplimental Oxygen during Test? (L/min) No No - No  Laps 2 4 - -  Partial Lap (in Meters) 6 0 - -  Baseline BP (sitting) 132/72 120/70 - -  Baseline Heartrate 85 80 - -  Baseline Dyspnea (Borg Scale) 5 1 - -  Baseline Fatigue (Borg Scale) 0 4 - -  Baseline SPO2 93 94 - -  BP (sitting) 152/76 142/82 - -  Heartrate 118 102 - -  Dyspnea (Borg Scale) 10 3 - -  Fatigue (Borg Scale) 3 4 - -  SPO2 84 90 - -  BP (sitting) 132/76 130/80 - -  Heartrate 86 72 - -  SPO2 94 92 - -  Stopped or Paused before Six Minutes Yes No - -  Other Symptoms at end of Exercise unable to finish after 2 minutes due to extreme SOB - - -  Interpretation - Hip pain;Leg pain - -  Distance Completed 102 192 - -  Tech Comments: Pt stopped at 2 minutes and sats were 84%. placed pt on O2 @ 2L and sats came up to 94% Pt walked a slow pace but gave good effort during the entire test. patient walked 144 meter.  patient completed 3 min walk; he was in severe pain (hips, back,and  knees) Pt did not have sob, cough, or chest tightness.    Pulmonary Functions Testing Results:  TLC  Date Value Ref Range Status  02/08/2015 7.90 L Preliminary     PMHX:   Past Medical History  Diagnosis Date  . Hypertensive heart disease   . Chronic diastolic CHF (congestive heart failure) ([place])     a. 04/2015 Echo: EF 55-60%, no rwma, mildly dil LA/RV/RA, PASP 44mmHg.  Marland Kitchen COPD (chronic obstructive pulmonary disease) ([place])     a. Uses O2 @ 3lpm via [place] around the clock.  . Depression   . GERD (gastroesophageal reflux disease)   . Arthritis   . Fibromyalgia   . Morbid obesity ([place])   . Osteoarthritis     a. Back, neck, and bilateral knee pain->severely limits activity.  . Hypertension    Surgical Hx:  Past Surgical History  Procedure Laterality Date  . Cholecystectomy    . Knee arthroscopy Left   . Vasectomy    . Hernia repair    . Shoulder surgery Right   . Irrigation and debridement sebaceous cyst N/A 12/07/2014    Procedure: IRRIGATION AND DEBRIDEMENT SEBACEOUS CYST;  Surgeon: Dia Crawford III, MD;  Location: [facility] ORS;  Service: General;  Laterality: N/A;   Family Hx:  Family History  Problem  Relation Age of Onset  . Cancer Father    Social Hx:   Social History  Substance Use Topics  . Smoking status: Former Smoker -- 0.25 packs/day for 35 years    Types: Cigarettes    Quit date: 01/26/2015  . Smokeless tobacco: Former Systems developer    Quit date: 11/22/1973  . Alcohol Use: No   Medication:   Current Outpatient Rx  Name  Route  Sig  Dispense  Refill  . acetaminophen (TYLENOL) 650 MG CR tablet   Oral   Take 1,300 mg by mouth every 8 (eight) hours as needed for pain.         Marland Kitchen albuterol (PROVENTIL HFA;VENTOLIN HFA) 108 (90 BASE) MCG/ACT inhaler   Inhalation   Inhale 1-2 puffs into the lungs every 4 (four) hours as needed for wheezing or shortness of breath.   1 Inhaler   6   . amoxicillin-clavulanate (AUGMENTIN) 500-125 MG tablet   Oral   Take 1 tablet (500 mg  total) by mouth every 12 (twelve) hours.   28 tablet   0   . esomeprazole (NEXIUM) 40 MG capsule   Oral   Take 40 mg by mouth 2 (two) times daily before a meal.         . esomeprazole (NEXIUM) 40 MG capsule   Oral   Take by mouth.         . fenofibrate (TRICOR) 48 MG tablet   Oral   Take 48 mg by mouth daily.         . Fluticasone-Salmeterol (ADVAIR) 500-50 MCG/DOSE AEPB   Inhalation   Inhale 1 puff into the lungs 2 (two) times daily.   60 each   4   . furosemide (LASIX) 40 MG tablet   Oral   Take 80 mg by mouth daily.         . hydrochlorothiazide (HYDRODIURIL) 25 MG tablet   Oral   Take 25 mg by mouth daily.         Marland Kitchen lisinopril (PRINIVIL,ZESTRIL) 40 MG tablet   Oral   Take 40 mg by mouth daily.         . potassium chloride (K-DUR) 10 MEQ tablet   Oral   Take 1 tablet (10 mEq total) by mouth daily.   90 tablet   3   . pregabalin (LYRICA) 50 MG capsule   Oral   Take 150 mg by mouth 2 (two) times daily.          . Tiotropium Bromide Monohydrate (SPIRIVA RESPIMAT) 1.25 MCG/ACT AERS   Inhalation   Inhale into the lungs.             Allergies:  Review of patient's allergies indicates no known allergies.  Review of Systems: Gen:  Denies  fever, sweats, chills HEENT: Denies blurred vision, double vision. bleeds, sore throat Cvc:  No dizziness, chest pain. Resp:   Denies cough or sputum porduction, shortness of breath Gi: Denies swallowing difficulty, stomach pain. Gu:  Denies bladder incontinence, burning urine Ext:   No Joint pain, stiffness. Skin: No skin rash,  hives Endoc:  No polyuria, polydipsia. Psych: No depression, insomnia. Other:  All other systems were reviewed with the patient and were negative other that what is mentioned in the HPI.   Physical Examination:   VS: There were no vitals taken for this visit.  General Appearance: No distress  Neuro:without focal findings,  speech normal,  HEENT: PERRLA, EOM intact.     Pulmonary:  normal breath sounds, No wheezing.  CardiovascularNormal S1,S2.  No m/r/g.   Abdomen: Benign, Soft, non-tender. Renal:  No costovertebral tenderness  GU:  No performed at this time. Endoc: No evident thyromegaly, no signs of acromegaly. Skin:   warm, no rashes, no ecchymosis  Extremities: normal, no cyanosis, clubbing.  Other findings:    LABORATORY PANEL:   CBC No results for input(s): WBC, HGB, HCT, PLT in the last 168 hours. ------------------------------------------------------------------------------------------------------------------  Chemistries   Recent Labs Lab 09/20/15 1434  CREATININE 3.30*   ------------------------------------------------------------------------------------------------------------------  Cardiac Enzymes No results for input(s): TROPONINI in the last 168 hours. ------------------------------------------------------------  RADIOLOGY:  No results found.     Thank  you for the consultation and for allowing [facility] to assist in the care of your patient. Our recommendations are noted above.  Please contact us if we can be of further service.   Marda Stalker, MD.  Board Certified in Internal Medicine, Pulmonary Medicine, [place], and Sleep Medicine.  [facility] Office Number: [phone]  Patricia Pesa, M.D.  Vilinda Boehringer, M.D.  Merton Border, M.D  This encounter was created in error - please disregard.
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
## 2015-09-29 ENCOUNTER — Ambulatory Visit: Payer: Medicaid Other | Admitting: Cardiovascular Disease

## 2015-09-29 NOTE — Telephone Encounter (Signed)
Pt daughter calling stating she would like a call back to give an update.Marland KitchenMarland Kitchen
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
## 2015-09-29 NOTE — Telephone Encounter (Signed)
Sorry to hear this news. Our condolences to the family.  -VM

## 2015-09-30 DEATH — deceased

## 2015-10-23 ENCOUNTER — Ambulatory Visit: Payer: Medicaid Other | Admitting: Internal Medicine

## 2015-10-27 ENCOUNTER — Ambulatory Visit: Payer: Medicaid Other | Admitting: Cardiovascular Disease

## 2015-11-13 ENCOUNTER — Ambulatory Visit: Payer: Medicaid Other

## 2015-11-15 ENCOUNTER — Ambulatory Visit: Payer: Medicaid Other | Admitting: Internal Medicine

## 2017-05-22 IMAGING — CR DG CHEST 2V
1 series · 2 of 2 positions shown · non-contrast
Comparison: PA and lateral chest x-ray dated November 23, 2008

CLINICAL DATA: Preoperative examination prior to cyst removal from
the patient's back, history of COPD, current smoker

EXAM:
CHEST  2 VIEW

[Series 1: dg chest 2 view · 0.14mm/px · 2 of 2 slices shown]
[im 1/2]
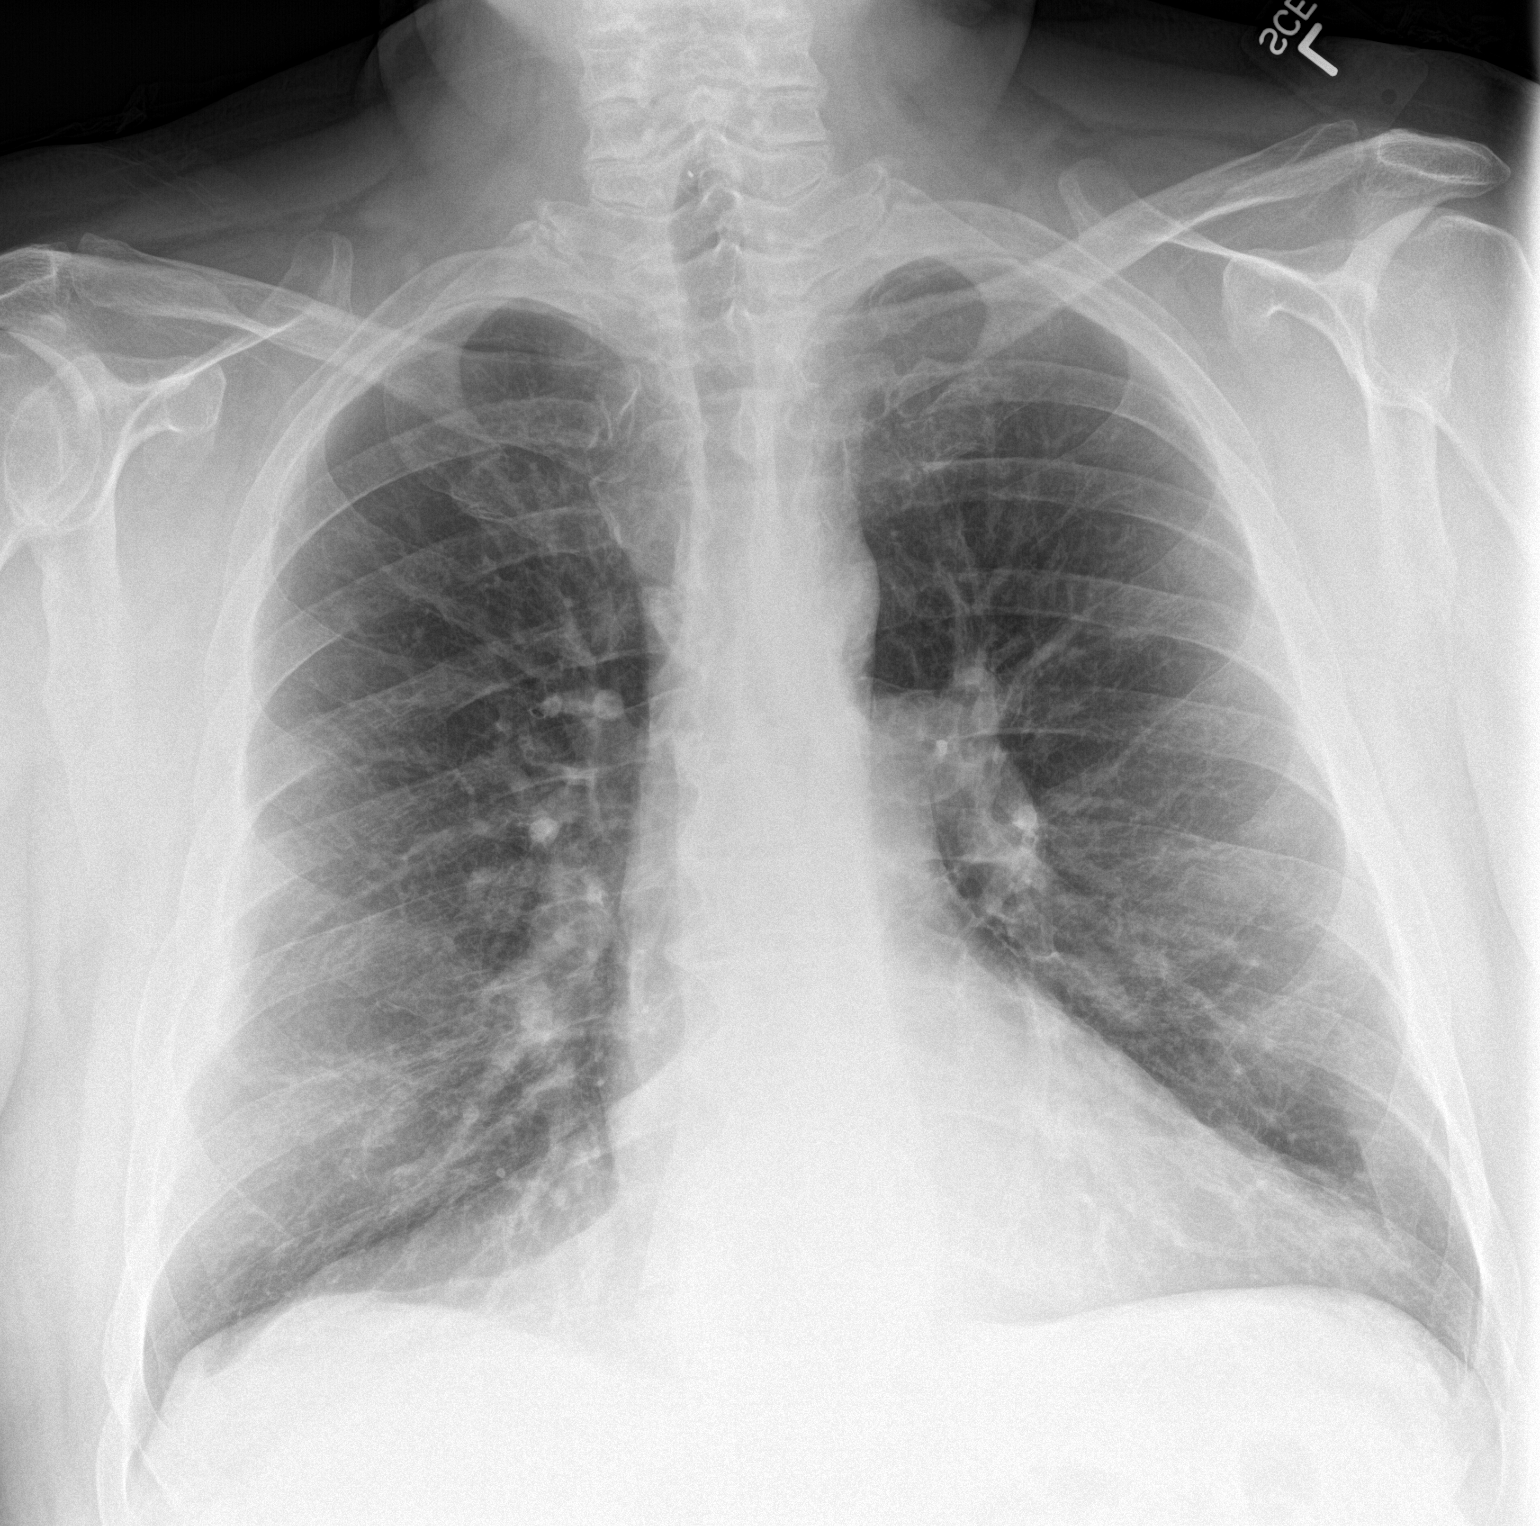
[im 2/2]
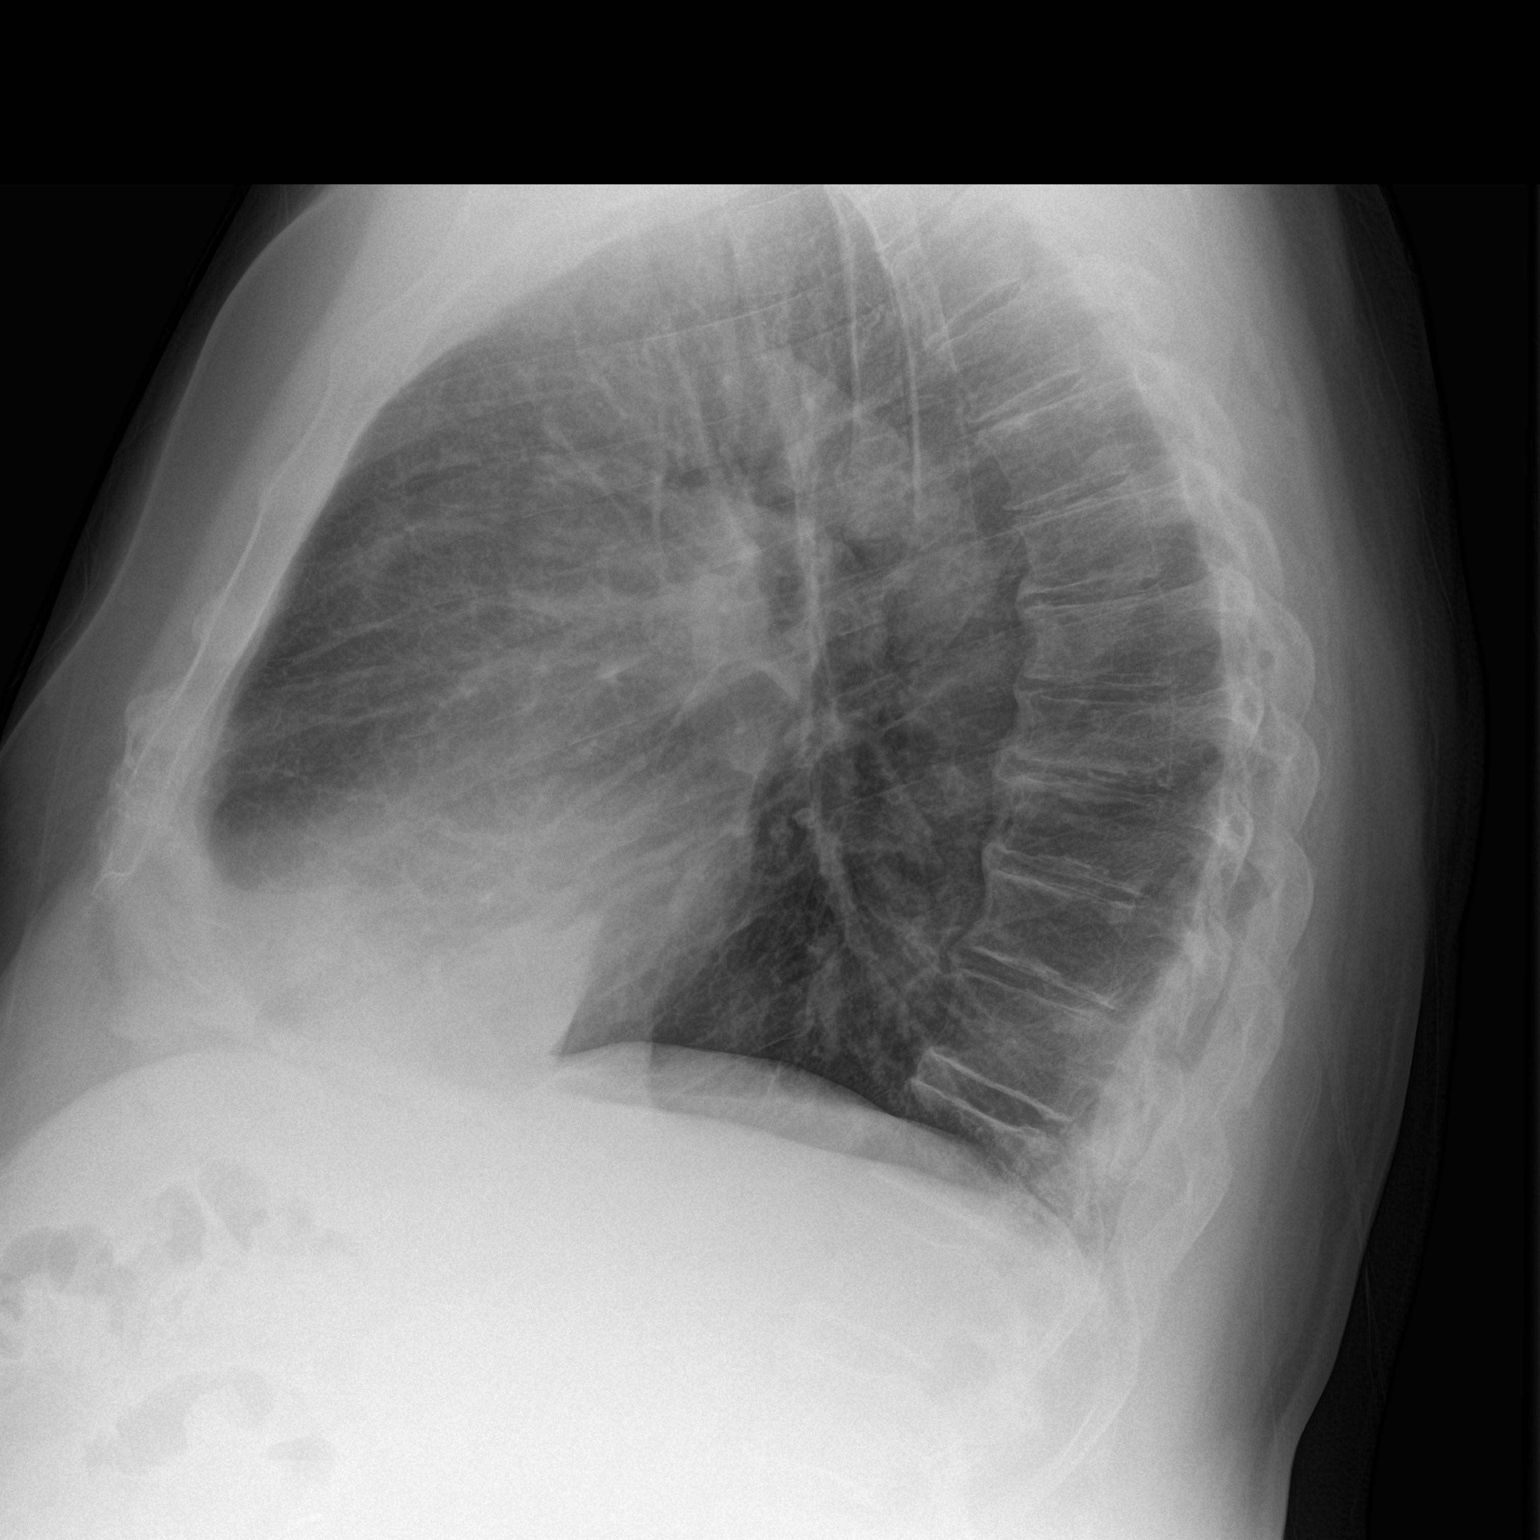

[2 of 2 positions shown; findings below may reference images not displayed]

FINDINGS: The lungs are mildly hyperinflated with hemidiaphragm flattening.
The interstitial markings are coarse bilaterally. There is no
interstitial or alveolar infiltrate. No pulmonary parenchymal
nodules are evident. The heart and pulmonary vascularity are normal.
The trachea is midline. The bony thorax is unremarkable.
IMPRESSION: COPD. Increased interstitial lung markings likely reflect the
patient's smoking history. There is no acute cardiopulmonary
abnormality.

## 2017-10-23 IMAGING — CR DG CHEST 2V
1 series · 2 of 2 positions shown · non-contrast
Comparison: PA and lateral chest x-ray November 23, 2014

CLINICAL DATA: Two days of chest pain with dyspnea on exertion,
history of COPD, current smoker

EXAM:
CHEST  2 VIEW

[Series 1: dg chest 2 view · 0.14mm/px · 2 of 2 slices shown]
[im 1/2]
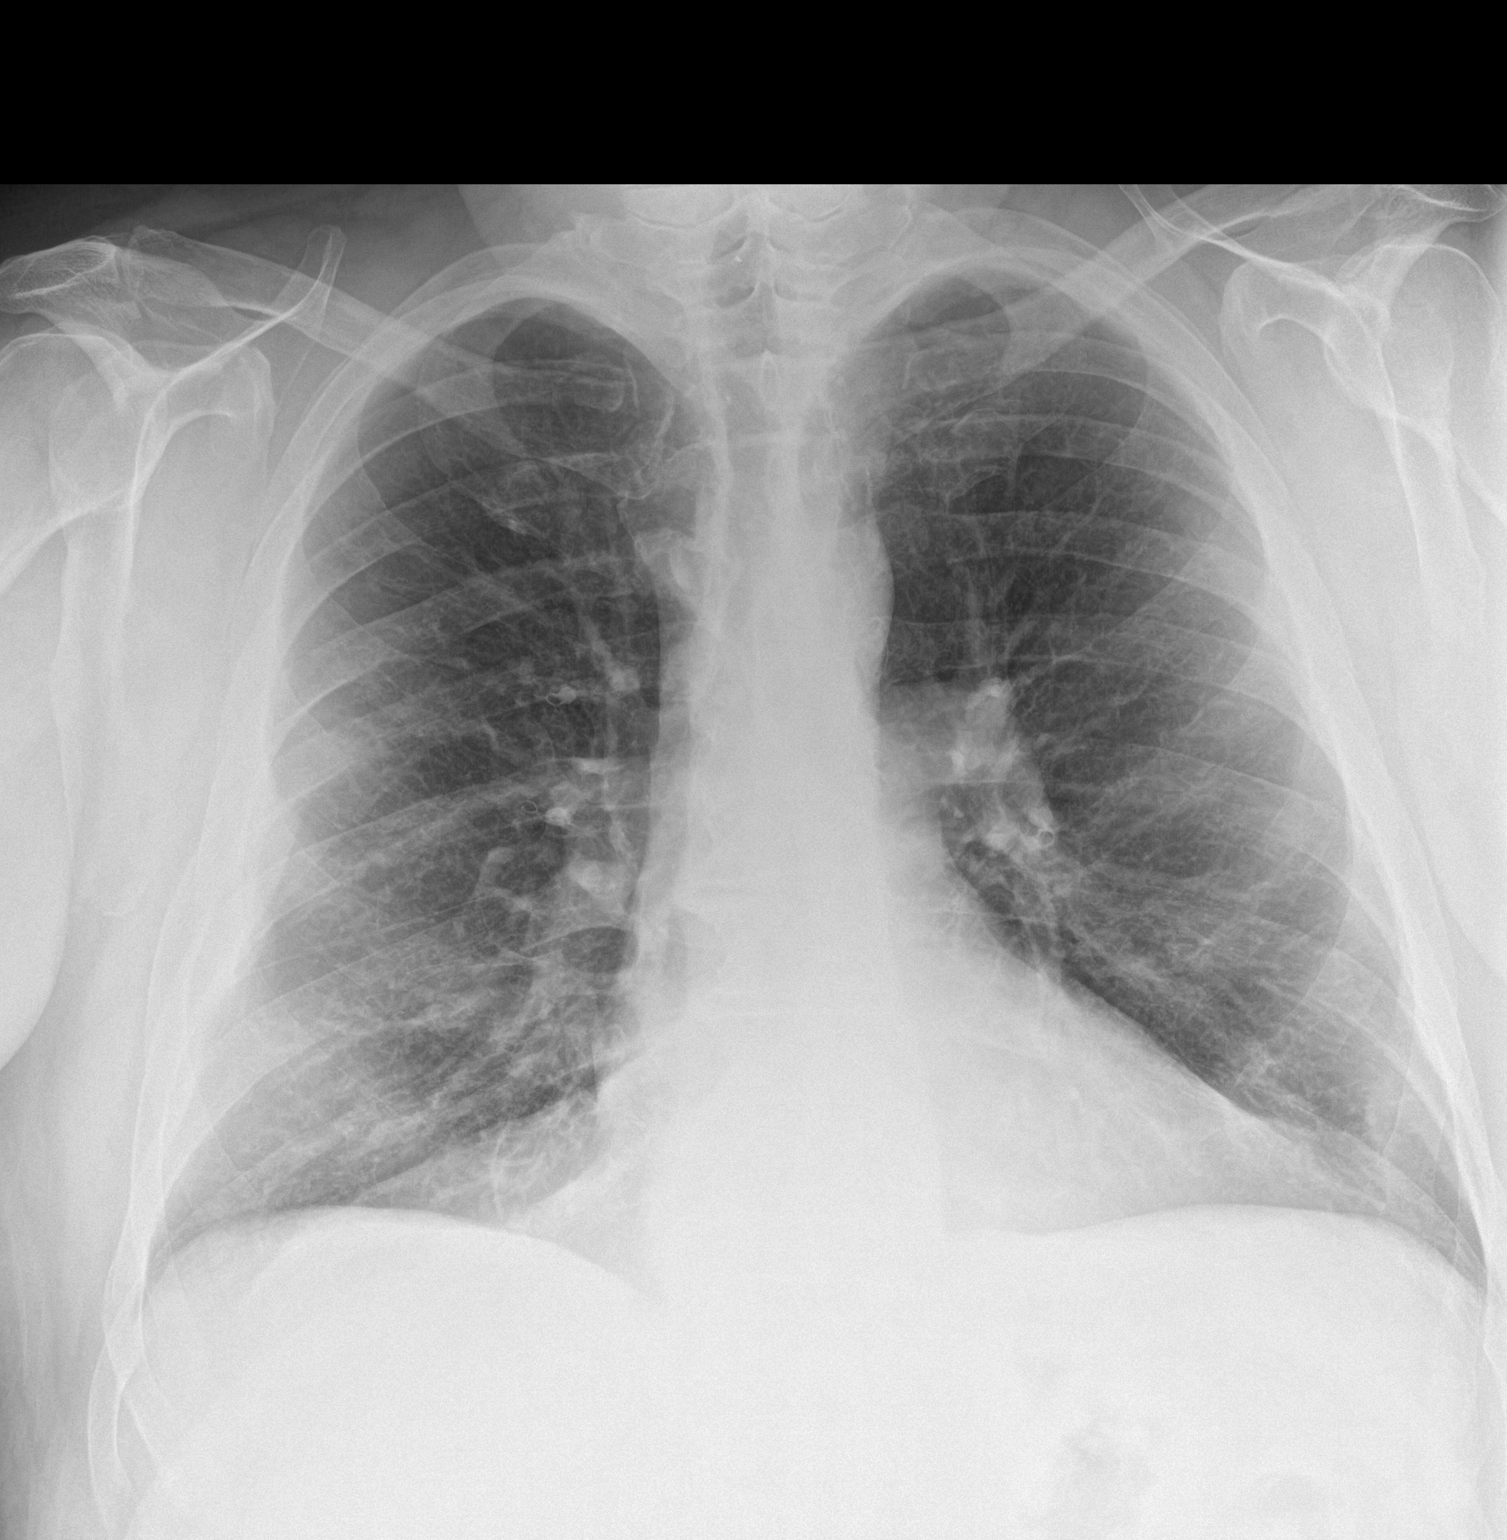
[im 2/2]
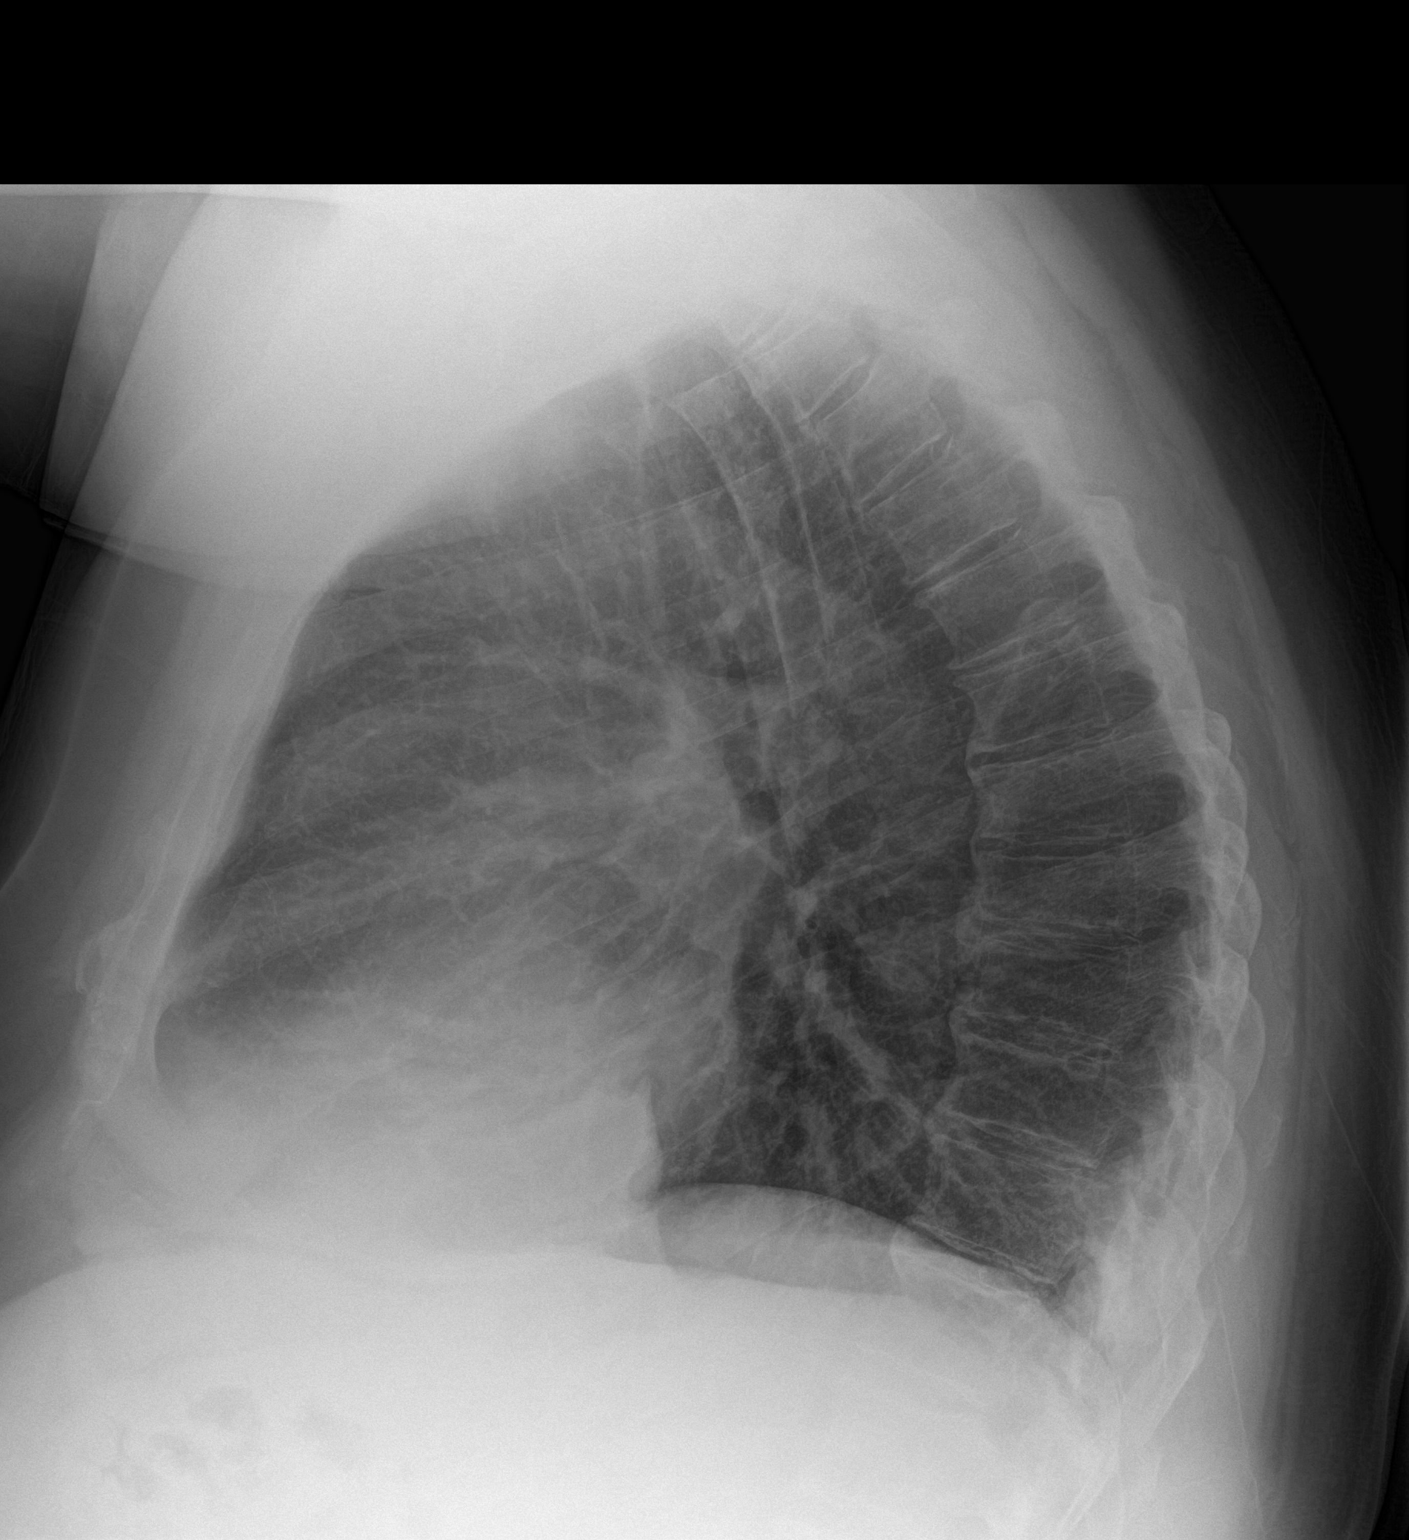

[2 of 2 positions shown; findings below may reference images not displayed]

FINDINGS: The lungs are mildly hyperinflated. The interstitial markings are
coarse bilaterally and slightly more conspicuous than on the
previous study. There is no alveolar infiltrate. There is no pleural
effusion or pneumothorax. The heart and pulmonary vascularity are
within the limits of normal. The mediastinum is normal in width.
There is mild multilevel degenerative disc disease of the thoracic
spine.
IMPRESSION: COPD with superimposed acute bronchitic changes. There is no
alveolar pneumonia nor CHF.
